# Patient Record
Sex: Female | Born: 1965 | Race: White | Hispanic: No | Smoking: Never smoker
Health system: Southern US, Community
[De-identification: ages and names within clinical notes are randomized; demographics above are authoritative.]

## PROBLEM LIST (undated history)

## (undated) DIAGNOSIS — Z789 Other specified health status: Secondary | ICD-10-CM

## (undated) DIAGNOSIS — K589 Irritable bowel syndrome without diarrhea: Secondary | ICD-10-CM

## (undated) DIAGNOSIS — R51 Headache: Secondary | ICD-10-CM

## (undated) DIAGNOSIS — M1990 Unspecified osteoarthritis, unspecified site: Secondary | ICD-10-CM

## (undated) DIAGNOSIS — L719 Rosacea, unspecified: Secondary | ICD-10-CM

## (undated) HISTORY — DX: Rosacea, unspecified: L71.9

## (undated) HISTORY — PX: MELANOMA EXCISION: SHX5266

## (undated) HISTORY — PX: MOHS SURGERY: SHX181

## (undated) HISTORY — PX: CHOLECYSTECTOMY: SHX55

---

## 2002-12-28 ENCOUNTER — Emergency Department (HOSPITAL_COMMUNITY): Admission: EM | Admit: 2002-12-28 | Discharge: 2002-12-28 | Payer: Self-pay | Admitting: Emergency Medicine

## 2003-01-19 ENCOUNTER — Other Ambulatory Visit: Admission: RE | Admit: 2003-01-19 | Discharge: 2003-01-19 | Payer: Self-pay | Admitting: Family Medicine

## 2003-04-18 ENCOUNTER — Encounter: Admission: RE | Admit: 2003-04-18 | Discharge: 2003-04-18 | Payer: Self-pay | Admitting: Gastroenterology

## 2003-04-18 ENCOUNTER — Encounter: Payer: Self-pay | Admitting: Gastroenterology

## 2005-12-16 HISTORY — PX: KNEE ARTHROSCOPY: SHX127

## 2006-12-11 ENCOUNTER — Ambulatory Visit (HOSPITAL_BASED_OUTPATIENT_CLINIC_OR_DEPARTMENT_OTHER): Admission: RE | Admit: 2006-12-11 | Discharge: 2006-12-11 | Payer: Self-pay | Admitting: Orthopedic Surgery

## 2008-06-06 ENCOUNTER — Ambulatory Visit: Payer: Self-pay | Admitting: Family Medicine

## 2008-06-06 ENCOUNTER — Other Ambulatory Visit: Admission: RE | Admit: 2008-06-06 | Discharge: 2008-06-06 | Payer: Self-pay | Admitting: Family Medicine

## 2008-06-06 ENCOUNTER — Encounter: Payer: Self-pay | Admitting: Family Medicine

## 2008-06-06 DIAGNOSIS — G43009 Migraine without aura, not intractable, without status migrainosus: Secondary | ICD-10-CM | POA: Insufficient documentation

## 2008-06-10 ENCOUNTER — Encounter (INDEPENDENT_AMBULATORY_CARE_PROVIDER_SITE_OTHER): Payer: Self-pay | Admitting: *Deleted

## 2008-06-13 ENCOUNTER — Encounter (INDEPENDENT_AMBULATORY_CARE_PROVIDER_SITE_OTHER): Payer: Self-pay | Admitting: *Deleted

## 2008-06-14 ENCOUNTER — Ambulatory Visit: Payer: Self-pay | Admitting: Family Medicine

## 2008-06-21 ENCOUNTER — Ambulatory Visit: Payer: Self-pay | Admitting: Family Medicine

## 2008-06-28 ENCOUNTER — Ambulatory Visit: Payer: Self-pay | Admitting: Family Medicine

## 2008-07-05 ENCOUNTER — Ambulatory Visit: Payer: Self-pay | Admitting: Family Medicine

## 2008-07-19 ENCOUNTER — Ambulatory Visit: Payer: Self-pay | Admitting: Family Medicine

## 2008-07-26 ENCOUNTER — Ambulatory Visit: Payer: Self-pay | Admitting: Family Medicine

## 2008-08-09 ENCOUNTER — Ambulatory Visit: Payer: Self-pay | Admitting: Family Medicine

## 2008-08-16 ENCOUNTER — Ambulatory Visit: Payer: Self-pay | Admitting: Family Medicine

## 2008-08-23 ENCOUNTER — Ambulatory Visit: Payer: Self-pay | Admitting: Family Medicine

## 2008-08-30 ENCOUNTER — Ambulatory Visit: Payer: Self-pay | Admitting: Family Medicine

## 2008-09-13 ENCOUNTER — Ambulatory Visit: Payer: Self-pay | Admitting: Family Medicine

## 2008-09-20 ENCOUNTER — Ambulatory Visit: Payer: Self-pay | Admitting: Family Medicine

## 2008-09-26 ENCOUNTER — Encounter: Payer: Self-pay | Admitting: Family Medicine

## 2008-09-27 ENCOUNTER — Ambulatory Visit: Payer: Self-pay | Admitting: Family Medicine

## 2008-10-02 LAB — CONVERTED CEMR LAB
ALT: 17 units/L (ref 0–35)
AST: 21 units/L (ref 0–37)
Albumin: 4.3 g/dL (ref 3.5–5.2)
Alkaline Phosphatase: 80 units/L (ref 39–117)
BUN: 16 mg/dL (ref 6–23)
CO2: 22 meq/L (ref 19–32)
Calcium: 8.9 mg/dL (ref 8.4–10.5)
Chloride: 105 meq/L (ref 96–112)
Cholesterol: 219 mg/dL — ABNORMAL HIGH (ref 0–200)
Creatinine, Ser: 0.74 mg/dL (ref 0.40–1.20)
Glucose, Bld: 86 mg/dL (ref 70–99)
HDL: 56 mg/dL (ref >39)
LDL Cholesterol: 128 mg/dL — ABNORMAL HIGH (ref 0–99)
Potassium: 4.3 meq/L (ref 3.5–5.3)
Sodium: 140 meq/L (ref 135–145)
Total Bilirubin: 0.3 mg/dL (ref 0.3–1.2)
Total CHOL/HDL Ratio: 3.9
Total Protein: 6.5 g/dL (ref 6.0–8.3)
Triglycerides: 176 mg/dL — ABNORMAL HIGH (ref <150)
VLDL: 35 mg/dL (ref 0–40)

## 2008-10-11 ENCOUNTER — Ambulatory Visit: Payer: Self-pay | Admitting: Family Medicine

## 2008-10-18 ENCOUNTER — Ambulatory Visit: Payer: Self-pay | Admitting: Family Medicine

## 2008-11-01 ENCOUNTER — Ambulatory Visit: Payer: Self-pay | Admitting: Family Medicine

## 2008-11-14 ENCOUNTER — Encounter: Admission: RE | Admit: 2008-11-14 | Discharge: 2008-11-14 | Payer: Self-pay | Admitting: Orthopedic Surgery

## 2008-11-15 ENCOUNTER — Ambulatory Visit: Payer: Self-pay | Admitting: Family Medicine

## 2008-11-22 ENCOUNTER — Ambulatory Visit: Payer: Self-pay | Admitting: Family Medicine

## 2008-11-29 ENCOUNTER — Telehealth (INDEPENDENT_AMBULATORY_CARE_PROVIDER_SITE_OTHER): Payer: Self-pay | Admitting: *Deleted

## 2008-11-29 ENCOUNTER — Ambulatory Visit: Payer: Self-pay | Admitting: Family Medicine

## 2008-12-27 ENCOUNTER — Ambulatory Visit: Payer: Self-pay | Admitting: Family Medicine

## 2009-01-03 ENCOUNTER — Ambulatory Visit: Payer: Self-pay | Admitting: Family Medicine

## 2009-01-17 ENCOUNTER — Ambulatory Visit: Payer: Self-pay | Admitting: Family Medicine

## 2009-01-17 ENCOUNTER — Encounter: Payer: Self-pay | Admitting: Family Medicine

## 2009-01-17 DIAGNOSIS — J019 Acute sinusitis, unspecified: Secondary | ICD-10-CM | POA: Insufficient documentation

## 2009-01-31 ENCOUNTER — Telehealth (INDEPENDENT_AMBULATORY_CARE_PROVIDER_SITE_OTHER): Payer: Self-pay | Admitting: *Deleted

## 2009-01-31 ENCOUNTER — Ambulatory Visit: Payer: Self-pay | Admitting: Family Medicine

## 2009-03-14 ENCOUNTER — Ambulatory Visit: Payer: Self-pay | Admitting: Family Medicine

## 2009-03-14 DIAGNOSIS — B029 Zoster without complications: Secondary | ICD-10-CM | POA: Insufficient documentation

## 2009-12-19 ENCOUNTER — Encounter: Admission: RE | Admit: 2009-12-19 | Discharge: 2009-12-19 | Payer: Self-pay | Admitting: Obstetrics and Gynecology

## 2009-12-20 ENCOUNTER — Encounter (INDEPENDENT_AMBULATORY_CARE_PROVIDER_SITE_OTHER): Payer: Self-pay | Admitting: *Deleted

## 2010-11-23 ENCOUNTER — Encounter
Admission: RE | Admit: 2010-11-23 | Discharge: 2010-11-23 | Payer: Self-pay | Source: Home / Self Care | Attending: Physical Medicine and Rehabilitation | Admitting: Physical Medicine and Rehabilitation

## 2010-12-16 HISTORY — PX: KNEE ARTHROSCOPY: SHX127

## 2010-12-31 ENCOUNTER — Ambulatory Visit
Admission: RE | Admit: 2010-12-31 | Discharge: 2010-12-31 | Payer: Self-pay | Source: Home / Self Care | Attending: Orthopedic Surgery | Admitting: Orthopedic Surgery

## 2011-01-02 LAB — POCT HEMOGLOBIN-HEMACUE: Hemoglobin: 13.4 g/dL (ref 12.0–15.0)

## 2011-01-03 NOTE — Op Note (Signed)
  NAMEMERRIN, MCVICKER                  ACCOUNT NO.:  [id number]  MEDICAL RECORD NO.:  [id number]          PATIENT TYPE:  AMB  LOCATION:  [facility]SC                          FACILITY:  [facility]  PHYSICIAN:  Feliberto Gottron. Turner Daniels, M.D.   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  PROCEDURE:  Right knee arthroscopic partial medial meniscectomy and debridement of chondromalacia grade 3 medial femoral condyle.  SURGEON:  Feliberto Gottron. Turner Daniels, MD  FIRST ASSISTANT:  None.  ANESTHETIC:  General LMA.  ESTIMATED BLOOD LOSS:  Minimal.  FLUID REPLACEMENT:  Crystalloid 800 mL .  DRAINS PLACED:  None.  TOURNIQUET TIME:  None.  INDICATIONS FOR PROCEDURE:  A 45 year old woman with MRI-proven medial meniscal tear along the root as well as chondromalacia of the medial femoral condyle with catching, popping, and pain who has failed conservative measures with anti-inflammatory medicines, physical therapy, and observation.  She desires elective arthroscopic evaluation and treatment of her right knee.  Risks and benefits of surgery have been discussed, questions answered.  DESCRIPTION OF PROCEDURE:  The patient was identified by armband and received preoperative vancomycin.  She was taken to operating room 2 at the [facility].  Appropriate anesthetic monitors were attached.  General LMA anesthesia was induced with the patient in supine position.  Lateral post applied to the table, and the right lower extremity prepped and draped in the usual sterile fashion from the ankle to the midthigh.  Time-out procedure performed.  We began the operation by making a standard inferomedial and inferolateral peripatellar portals.  The  arthroscope was introduced through the inferolateral portal and the outflow through the inferomedial portal.  Diagnostic arthroscopy revealed very mild chondromalacia of the patellofemoral joint, lightly debrided with a 3.5 gator sucker shaver.  Pump pressure was kept between 16 and 100 mmHg.  Moving on the medial compartment, the patient did have chondromalacia grade 3 with flap tears medial femoral condyle distally and posteriorly and this was debrided back to a stable margin.  The root tear of the medial meniscus was also identified and removed with a small and large straight biters as well as the 3.5 gator sucker shaver.  The ACL and the PCL were intact.  The lateral compartment had very mild chondromalacia of the lateral tibial plateau grade 1-2 lightly debrided.  The meniscus and lateral femoral condyle were in excellent condition.  The gutters were cleared medially and laterally.  We irrigated out with normal saline solution.  Arthroscopic instruments were removed.  Dressing of Xeroform, 4 x 4 dressing, sponges, Webril, and an Ace wrap applied, awakened and taken to recovery room without difficulty.     Feliberto Gottron. Turner Daniels, M.D.     Ovid Curd  D:  12/31/2010  T:  01/01/2011  Job:  [id number]  Electronically Signed by Gean Birchwood M.D. on 01/03/2011 01:25:44 PM

## 2011-01-06 ENCOUNTER — Encounter: Payer: Self-pay | Admitting: Obstetrics and Gynecology

## 2011-01-13 LAB — CONVERTED CEMR LAB
ALT: 20 units/L (ref 0–35)
AST: 21 units/L (ref 0–37)
Albumin: 4.1 g/dL (ref 3.5–5.2)
Alkaline Phosphatase: 78 units/L (ref 39–117)
BUN: 13 mg/dL (ref 6–23)
Basophils Absolute: 0 10*3/uL (ref 0.0–0.1)
Basophils Relative: 0 % (ref 0.0–1.0)
Bilirubin, Direct: 0.1 mg/dL (ref 0.0–0.3)
CO2: 26 meq/L (ref 19–32)
Calcium: 8.9 mg/dL (ref 8.4–10.5)
Chloride: 105 meq/L (ref 96–112)
Cholesterol: 212 mg/dL (ref 0–200)
Creatinine, Ser: 0.8 mg/dL (ref 0.4–1.2)
Direct LDL: 143 mg/dL
Eosinophils Absolute: 0.1 10*3/uL (ref 0.0–0.7)
Eosinophils Relative: 1.6 % (ref 0.0–5.0)
Free T4: 0.9 ng/dL (ref 0.6–1.6)
GFR calc Af Amer: 100 mL/min
GFR calc non Af Amer: 83 mL/min
Glucose, Bld: 90 mg/dL (ref 70–99)
HCT: 38.8 % (ref 36.0–46.0)
HDL: 51 mg/dL (ref >39.0)
Hemoglobin: 13.2 g/dL (ref 12.0–15.0)
IgM, Serum: 114 mg/dL (ref 60–263)
Lymphocytes Relative: 32.8 % (ref 12.0–46.0)
MCHC: 34.1 g/dL (ref 30.0–36.0)
MCV: 87.6 fL (ref 78.0–100.0)
Monocytes Absolute: 0.2 10*3/uL (ref 0.1–1.0)
Monocytes Relative: 2.7 % — ABNORMAL LOW (ref 3.0–12.0)
Neutro Abs: 5.1 10*3/uL (ref 1.4–7.7)
Neutrophils Relative %: 62.9 % (ref 43.0–77.0)
Platelets: 302 10*3/uL (ref 150–400)
Potassium: 4.3 meq/L (ref 3.5–5.1)
RBC: 4.43 M/uL (ref 3.87–5.11)
RDW: 12.4 % (ref 11.5–14.6)
Sodium: 139 meq/L (ref 135–145)
TSH: 0.85 microintl units/mL (ref 0.35–5.50)
Total Bilirubin: 0.7 mg/dL (ref 0.3–1.2)
Total CHOL/HDL Ratio: 4.2
Total Protein: 6.9 g/dL (ref 6.0–8.3)
Triglycerides: 114 mg/dL (ref 0–149)
Uric Acid, Serum: 4.9 mg/dL (ref 2.4–7.0)
VLDL: 23 mg/dL (ref 0–40)
WBC: 8.1 10*3/uL (ref 4.5–10.5)

## 2011-01-15 NOTE — Letter (Signed)
Summary: Imaging Results Letter  [facility]  [address] [place], [place] [address]   Phone: [phone]  Fax: [phone]    12/20/2009 MRN: [id number]  [facility] [address] [place], [place]  [address]  Dear Ms. Forbis,    The following is the interpretation of your radiology test which was performed on 12/19/2009.              Radiology Test:                      Normal __X___  Not Normal _____   Comments:        Sincerely yours,    Army Fossa CMA

## 2011-05-03 NOTE — Op Note (Signed)
NAMENEOSHA, SWITALSKI          ACCOUNT NO.:  [id number]   MEDICAL RECORD NO.:  [id number]          PATIENT TYPE:  AMB   LOCATION:  [facility]                         FACILITY:  [facility]   PHYSICIAN:  Deidre Ala, M.D.    DATE OF BIRTH:  01-13-1966   DATE OF PROCEDURE:  12/11/2006  DATE OF DISCHARGE:                               OPERATIVE REPORT   PREOPERATIVE DIAGNOSIS:  Patellofemoral degenerative joint disease.   POSTOPERATIVE DIAGNOSES:  1. 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  2. Degenerative joint disease, lateral tibial plateau.  3. Tight lateral retinaculum.  4. Large medial and lateral plicas.   OPERATION:  1. Left knee operative arthroscopy with abrasion, ablation      chondroplasty of posterior patella and lateral tibial plateau.  2. Arthroscopic lateral retinacular release.  3. Medial and lateral plica excision.   SURGEON:  1. Charlesetta Shanks, M.D.   ASSISTANT:  Phineas Semen, P.A., and Shawna Clamp, PA Student.   ANESTHESIA:  General with LMA.   CULTURES:  None.   DRAINS:  None.   ESTIMATED BLOOD LOSS:  Minimal.   TOURNIQUET TIME:  35 minutes.   PATHOLOGIC FINDINGS AND HISTORY:  Ataya is a 45 year old female with  high body mass index, who has had knee pain after a twisting injury to  her knee over a year ago when she saw me October 16, 2005.  We have  treated her conservatively with cortisone injections.  MRI scan  indicated patellofemoral disease but negative for meniscal tear or cyst.  She had a series of injections, always were helpful, but her pain  persisted and she desired surgical intervention.  At surgery she had  about over 3/4 of the posterior patella grade 3-4 patellofemoral  disease, blood points to bone.  She had a tight lateral retinaculum.  She had marked medial and lateral plicas.  She had ACL intact, both  menisci intact, and a deep fissure along the lateral tibial plateau.  The osteoarthritis was smoothed with the shaver and the  ablator on 1,  lateral retinacular release carried out and plicas excised.   PROCEDURE:  With adequate anesthesia obtained using LMA technique, 1 g  Ancef given IV prophylaxis, the patient was placed in the supine  position.  The left lower extremity was prepped from the malleoli to the  leg holder in a standard fashion.  After standard prepping and draping,  Esmarch exsanguination was used.  The tourniquet was let up to 375 mmHg.  Superior lateral inflow portal was made.  The knee was insufflated with  normal saline with the arthroscopic pump.  Medial and lateral scope  portals were then made and the joint was thoroughly inspected.  I then  shaved out the medial plica back the sidewall and lysed the medial band.  I then shaved out the fat in the ligamentum mucosum and then exposed the  ACL and probed it.  I then opened the knee and a valgus motion and  checked the medial meniscus, which was intact to probing.  The joint  surfaces looked good.  I then shaved the posterior patella and smoothed  with the ablator on 1.  Portals were reversed and I checked the lateral  compartment, probed the lateral fissure within the lateral tibial  plateau, and smoothed with the shaver and the ablator.  I then smoothed  the posterior patella with the ablator again and did an arthroscopic  lateral retinacular release from the vastus lateralis to the joint line  with good improvement in tilt and track.  I then had some chondromalacia  on the medial patellar facet, which was still difficult to get to, so I  made a superior medial portal and took the shaver and ablator in and  smoothed that.  When I was satisfied with the resection, the knee was  irrigated through the scope.  Marcaine 0.5% with morphine was injected  into the joint.  The portals were left open.  A bulky sterile  compressive dressing was applied with a lateral foam pad for tamponade,  and an Easy Wrap placed.  The patient then, having tolerated  the  procedure well, was awakened, taken to the recovery room in satisfactory  condition, to be discharged to return tomorrow or the first week,  depending on transportation.  Given Percocet for pain.           ______________________________  V. Charlesetta Shanks, M.D.     VEP/MEDQ  D:  12/11/2006  T:  12/11/2006  Job:  [id number]

## 2011-06-11 ENCOUNTER — Other Ambulatory Visit: Payer: Self-pay | Admitting: Family Medicine

## 2011-06-11 DIAGNOSIS — Z1231 Encounter for screening mammogram for malignant neoplasm of breast: Secondary | ICD-10-CM

## 2011-06-12 ENCOUNTER — Ambulatory Visit
Admission: RE | Admit: 2011-06-12 | Discharge: 2011-06-12 | Disposition: A | Discharge status: Home / Self Care | Payer: 59 | Source: Ambulatory Visit | Attending: Family Medicine | Admitting: Family Medicine

## 2011-06-12 DIAGNOSIS — Z1231 Encounter for screening mammogram for malignant neoplasm of breast: Secondary | ICD-10-CM

## 2011-11-21 ENCOUNTER — Other Ambulatory Visit: Payer: Self-pay | Admitting: Orthopedic Surgery

## 2011-11-25 ENCOUNTER — Encounter (HOSPITAL_BASED_OUTPATIENT_CLINIC_OR_DEPARTMENT_OTHER): Payer: Self-pay | Admitting: *Deleted

## 2011-11-26 NOTE — H&P (Signed)
  HISTORY OF PRESENT ILLNESS:  Leslie Fischer had the onset of left knee pain about three weeks ago. She was seen by Dr. Regino Schultze who ordered an MRI scan, which did show a radial tear of the medial meniscus at the junction of the anterior and middle horns, similar to what she had in her right knee prior to surgery back in early 2012.  She is here to discuss her options and is leaning heavily toward surgery, but probably cannot get it down until sometime after Thanksgiving and therefore we discussed a cortisone injection.  REVIEW OF SYSTEMS: Patient denies dizziness, nausea, fever, chills, vomiting, shortness of breath, chest pain, loss of appetite, or rash.    OBJECTIVE:  The patient is a well-developed and well-nourished 45 year old female in no acute distress while seated on the exam table today. .  Eyes not dilated.  No use of accessory muscles for breathing.  No rashes on the skin.  She is alert and oriented x3. She is tender to palpation along the medial joint line.  Range of motion is 0 to approximately 110 degrees, limited by adipose tissue.  No palpable effusion, but her physical exam is somewhat difficult as her BMI is well over 40.  Quadriceps and hamstring strength are excellent.  She has normal motor and sensory exams and deep tendon reflexes are normal.  RADIOGRAPHS:  Previous x-rays are reviewed showing moderate to severe arthritis on the right side at the medial compartment and mild arthritis on the left side at the medial compartment.  IMPRESSION:  Symptomatic medial meniscal tear left knee, similar to findings on the right knee prior to surgery in January of 2012.  PLAN: Injection as above.  I am also going to have her talk to Raylene Miyamoto about left knee arthroscopy at her [facility].

## 2011-11-27 ENCOUNTER — Encounter (HOSPITAL_BASED_OUTPATIENT_CLINIC_OR_DEPARTMENT_OTHER): Admission: RE | Payer: Self-pay | Source: Ambulatory Visit

## 2011-11-27 ENCOUNTER — Ambulatory Visit (HOSPITAL_BASED_OUTPATIENT_CLINIC_OR_DEPARTMENT_OTHER): Admission: RE | Admit: 2011-11-27 | Payer: 59 | Source: Ambulatory Visit | Admitting: Orthopedic Surgery

## 2011-11-27 HISTORY — DX: Irritable bowel syndrome, unspecified: K58.9

## 2011-11-27 HISTORY — DX: Headache: R51

## 2011-11-27 HISTORY — DX: Unspecified osteoarthritis, unspecified site: M19.90

## 2011-11-27 HISTORY — DX: Other specified health status: Z78.9

## 2011-11-27 SURGERY — ARTHROSCOPY, KNEE, BILATERAL
Anesthesia: Choice | Laterality: Left

## 2012-03-18 DIAGNOSIS — E669 Obesity, unspecified: Secondary | ICD-10-CM

## 2012-03-25 DIAGNOSIS — E669 Obesity, unspecified: Secondary | ICD-10-CM

## 2012-04-08 DIAGNOSIS — E669 Obesity, unspecified: Secondary | ICD-10-CM

## 2012-04-15 DIAGNOSIS — E669 Obesity, unspecified: Secondary | ICD-10-CM

## 2012-04-29 DIAGNOSIS — E669 Obesity, unspecified: Secondary | ICD-10-CM

## 2012-05-06 DIAGNOSIS — E669 Obesity, unspecified: Secondary | ICD-10-CM

## 2012-05-15 DIAGNOSIS — E669 Obesity, unspecified: Secondary | ICD-10-CM

## 2012-05-20 DIAGNOSIS — E669 Obesity, unspecified: Secondary | ICD-10-CM

## 2012-05-27 DIAGNOSIS — E669 Obesity, unspecified: Secondary | ICD-10-CM

## 2012-06-01 ENCOUNTER — Ambulatory Visit
Admission: RE | Admit: 2012-06-01 | Discharge: 2012-06-01 | Disposition: A | Discharge status: Home / Self Care | Payer: 59 | Source: Ambulatory Visit | Attending: Family Medicine | Admitting: Family Medicine

## 2012-06-01 ENCOUNTER — Other Ambulatory Visit: Payer: Self-pay | Admitting: Family Medicine

## 2012-06-01 DIAGNOSIS — R1031 Right lower quadrant pain: Secondary | ICD-10-CM

## 2012-06-01 DIAGNOSIS — K5900 Constipation, unspecified: Secondary | ICD-10-CM

## 2012-06-01 MED ORDER — IOHEXOL 300 MG/ML  SOLN
30.0000 mL | Freq: Once | INTRAMUSCULAR | Status: AC | PRN
  Administered 2012-06-01: 30 mL via ORAL

## 2012-06-01 MED ORDER — IOHEXOL 300 MG/ML  SOLN
125.0000 mL | Freq: Once | INTRAMUSCULAR | Status: AC | PRN
  Administered 2012-06-01: 125 mL via INTRAVENOUS

## 2012-06-10 DIAGNOSIS — E669 Obesity, unspecified: Secondary | ICD-10-CM

## 2012-06-17 ENCOUNTER — Other Ambulatory Visit: Payer: Self-pay | Admitting: Family Medicine

## 2012-06-17 DIAGNOSIS — E669 Obesity, unspecified: Secondary | ICD-10-CM

## 2012-06-17 LAB — LIPID PANEL
Cholesterol: 195 mg/dL (ref 0–200)
HDL: 41 mg/dL (ref >39)
LDL Cholesterol: 117 mg/dL — ABNORMAL HIGH (ref 0–99)
Total CHOL/HDL Ratio: 4.8 Ratio
Triglycerides: 183 mg/dL — ABNORMAL HIGH (ref <150)
VLDL: 37 mg/dL (ref 0–40)

## 2012-06-17 LAB — COMPREHENSIVE METABOLIC PANEL
ALT: 20 U/L (ref 0–35)
AST: 20 U/L (ref 0–37)
Albumin: 4.2 g/dL (ref 3.5–5.2)
Alkaline Phosphatase: 69 U/L (ref 39–117)
BUN: 16 mg/dL (ref 6–23)
CO2: 26 mEq/L (ref 19–32)
Calcium: 9.2 mg/dL (ref 8.4–10.5)
Chloride: 105 mEq/L (ref 96–112)
Creat: 0.9 mg/dL (ref 0.50–1.10)
Glucose, Bld: 94 mg/dL (ref 70–99)
Potassium: 4.9 mEq/L (ref 3.5–5.3)
Sodium: 141 mEq/L (ref 135–145)
Total Bilirubin: 0.3 mg/dL (ref 0.3–1.2)
Total Protein: 6.5 g/dL (ref 6.0–8.3)

## 2012-06-24 DIAGNOSIS — E669 Obesity, unspecified: Secondary | ICD-10-CM

## 2012-07-01 DIAGNOSIS — E669 Obesity, unspecified: Secondary | ICD-10-CM

## 2012-08-27 ENCOUNTER — Other Ambulatory Visit: Payer: Self-pay | Admitting: Family Medicine

## 2012-08-27 DIAGNOSIS — Z1231 Encounter for screening mammogram for malignant neoplasm of breast: Secondary | ICD-10-CM

## 2012-09-14 ENCOUNTER — Ambulatory Visit: Payer: 59

## 2012-12-14 ENCOUNTER — Ambulatory Visit: Payer: 59

## 2012-12-14 ENCOUNTER — Ambulatory Visit
Admission: RE | Admit: 2012-12-14 | Discharge: 2012-12-14 | Disposition: A | Discharge status: Home / Self Care | Payer: 59 | Source: Ambulatory Visit | Attending: Family Medicine | Admitting: Family Medicine

## 2012-12-14 DIAGNOSIS — Z1231 Encounter for screening mammogram for malignant neoplasm of breast: Secondary | ICD-10-CM

## 2013-01-27 DIAGNOSIS — E669 Obesity, unspecified: Secondary | ICD-10-CM

## 2020-03-27 ENCOUNTER — Ambulatory Visit: Payer: Self-pay

## 2020-04-07 ENCOUNTER — Ambulatory Visit
Admission: EM | Admit: 2020-04-07 | Discharge: 2020-04-07 | Disposition: A | Discharge status: Home / Self Care | Payer: Self-pay | Attending: Emergency Medicine | Admitting: Emergency Medicine

## 2020-04-07 DIAGNOSIS — W57XXXA Bitten or stung by nonvenomous insect and other nonvenomous arthropods, initial encounter: Secondary | ICD-10-CM

## 2020-04-07 DIAGNOSIS — M79645 Pain in left finger(s): Secondary | ICD-10-CM

## 2020-04-07 DIAGNOSIS — S60465A Insect bite (nonvenomous) of left ring finger, initial encounter: Secondary | ICD-10-CM

## 2020-04-07 MED ORDER — PREDNISONE 10 MG PO TABS: 20.0000 mg | ORAL_TABLET | Freq: Every day | ORAL | 0 refills | Status: DC

## 2020-04-07 MED ORDER — CEPHALEXIN 500 MG PO CAPS: 500.0000 mg | ORAL_CAPSULE | Freq: Four times a day (QID) | ORAL | 0 refills | Status: DC

## 2020-04-07 NOTE — ED Provider Notes (Signed)
[facility]    CSN: [id number] Arrival date & time: 04/07/20  0810      History   Chief Complaint Chief Complaint  Patient presents with  . Insect Bite    HPI Leslie Fischer is a 54 y.o. female.   Who presented to the urgent care for complaint of left ring finger bite for the past 1 day.  Denies a precipitating event, noticed  this morning.  Localizes the bite to her left ring finger.  Reported she has a wedding ring at this location in her finger is swollen, therefore  the ring is stuck.  Has not tried any medication.  Denies previous hx of insect bite.  Reports she is unsure of what insect bite her.  Denies fever, chills, nausea, vomiting, headache, dizziness, weakness, fatigue, rash, or abdominal pain.   The history is provided by the patient. No language interpreter was used.    Past Medical History:  Diagnosis Date  . Arthritis   . Headache(784.0)   . IBS (irritable bowel syndrome)   . No pertinent past medical history     Patient Active Problem List   Diagnosis Date Noted  . SHINGLES 03/14/2009  . SINUSITIS- ACUTE-NOS 01/17/2009  . MORBID OBESITY 06/06/2008  . COMMON MIGRAINE 06/06/2008    Past Surgical History:  Procedure Laterality Date  . CHOLECYSTECTOMY    . KNEE ARTHROSCOPY  1/12   rt   . KNEE ARTHROSCOPY  2007   lt     OB History   No obstetric history on file.      Home Medications    Prior to Admission medications   Medication Sig Start Date End Date Taking? Authorizing Provider  cephALEXin (KEFLEX) 500 MG capsule Take 1 capsule (500 mg total) by mouth 4 (four) times daily. 04/07/20   Emelie Newsom, Darrelyn Hillock, FNP  predniSONE (DELTASONE) 10 MG tablet Take 2 tablets (20 mg total) by mouth daily. 04/07/20   AvegnoDarrelyn Hillock, FNP    Family History Family History  Problem Relation Age of Onset  . Hypertension Mother     Social History Social History   Tobacco Use  . Smoking status: Never Smoker  Substance Use Topics  . Alcohol  use: No  . Drug use: No     Allergies   Patient has no known allergies.   Review of Systems Review of Systems  Constitutional: Negative.   Respiratory: Negative.   Cardiovascular: Negative.   Skin: Positive for color change.  All other systems reviewed and are negative.    Physical Exam Triage Vital Signs ED Triage Vitals  Enc Vitals Group     BP      Pulse      Resp      Temp      Temp src      SpO2      Weight      Height      Head Circumference      Peak Flow      Pain Score      Pain Loc      Pain Edu?      Excl. in [place]?    No data found.  Updated Vital Signs BP (!) 169/113   Pulse (!) 104   Temp 98.3 F (36.8 C)   Resp 18   LMP 05/18/2012   SpO2 95%   Visual Acuity Right Eye Distance:   Left Eye Distance:   Bilateral Distance:    Right Eye Near:  Left Eye Near:    Bilateral Near:     Physical Exam Vitals and nursing note reviewed.  Constitutional:      General: She is not in acute distress.    Appearance: Normal appearance. She is normal weight. She is not ill-appearing, toxic-appearing or diaphoretic.  Cardiovascular:     Rate and Rhythm: Normal rate and regular rhythm.     Pulses: Normal pulses.     Heart sounds: Normal heart sounds. No murmur. No friction rub. No gallop.   Pulmonary:     Effort: Pulmonary effort is normal. No respiratory distress.     Breath sounds: Normal breath sounds. No stridor. No wheezing, rhonchi or rales.  Chest:     Chest wall: No tenderness.  Musculoskeletal:        General: Normal range of motion.  Skin:    Findings: Erythema present. No rash.     Comments: Swelling and erythema present on left ring finger. Unable to remove wedding ring.  Neurological:     Mental Status: She is alert.      UC Treatments / Results  Labs (all labs ordered are listed, but only abnormal results are displayed) Labs Reviewed - No data to display  EKG   Radiology No results found.  Procedures Procedures  (including critical care time)  Medications Ordered in UC Medications - No data to display  Initial Impression / Assessment and Plan / UC Course  I have reviewed the triage vital signs and the nursing notes.  Pertinent labs & imaging results that were available during my care of the patient were reviewed by me and considered in my medical decision making (see chart for details).    Patient  is stable for discharge.  Keflex will be prescribed.  Prednisone as prescribed to decrease inflammation.  Return or go to ED for worsening symptoms.  Final Clinical Impressions(s) / UC Diagnoses   Final diagnoses:  Insect bite of left ring finger, initial encounter  Finger pain, left     Discharge Instructions     Take antibiotic as prescribed and to completion Take prednisone as prescribed Follow-up with PCP Return or go to ED for worsening symptoms    ED Prescriptions    Medication Sig Dispense Auth. Provider   cephALEXin (KEFLEX) 500 MG capsule  (Status: Discontinued) Take 1 capsule (500 mg total) by mouth 4 (four) times daily. 20 capsule Robyn Galati S, FNP   predniSONE (DELTASONE) 10 MG tablet  (Status: Discontinued) Take 2 tablets (20 mg total) by mouth daily. 15 tablet Shanae Luo, Zachery Dakins, FNP   cephALEXin (KEFLEX) 500 MG capsule  (Status: Discontinued) Take 1 capsule (500 mg total) by mouth 4 (four) times daily. 20 capsule Cahlil Sattar S, FNP   predniSONE (DELTASONE) 10 MG tablet  (Status: Discontinued) Take 2 tablets (20 mg total) by mouth daily. 15 tablet Keiji Melland, Zachery Dakins, FNP   cephALEXin (KEFLEX) 500 MG capsule Take 1 capsule (500 mg total) by mouth 4 (four) times daily. 20 capsule Mirakle Tomlin S, FNP   predniSONE (DELTASONE) 10 MG tablet Take 2 tablets (20 mg total) by mouth daily. 15 tablet Elisah Parmer, Zachery Dakins, FNP     PDMP not reviewed this encounter.   Durward Parcel, FNP 04/07/20 [phone]

## 2020-04-07 NOTE — ED Triage Notes (Signed)
Pt has insect bite on ring finger and ring is stuck

## 2020-04-07 NOTE — Discharge Instructions (Signed)
Take antibiotic as prescribed and to completion Take prednisone as prescribed Follow-up with PCP Return or go to ED for worsening symptoms

## 2020-04-10 ENCOUNTER — Other Ambulatory Visit: Payer: Self-pay

## 2020-04-10 ENCOUNTER — Ambulatory Visit
Admission: EM | Admit: 2020-04-10 | Discharge: 2020-04-10 | Disposition: A | Discharge status: Home / Self Care | Payer: Self-pay

## 2020-04-10 DIAGNOSIS — L02512 Cutaneous abscess of left hand: Secondary | ICD-10-CM

## 2020-04-10 NOTE — Discharge Instructions (Signed)
Abscess lanced Begin appling warm compresses 3-4x daily for 10-15 minutes.  You may then wash site daily with warm water and mild soap Keep covered to avoid friction Apply neosporin with dressing changes Continue with antibiotic as prescribed and to completion Return or go to the ED if you have any new or worsening symptoms such as increased redness, swelling, pain, nausea, vomiting, fever, chills, etc..Marland Kitchen

## 2020-04-10 NOTE — ED Triage Notes (Signed)
Pt presents with increased abscess on ring finger from possible insect bite, pain is improved

## 2020-04-10 NOTE — ED Provider Notes (Addendum)
[facility]   [id number] 04/10/20 Arrival Time: 0840   CC: Incision and drainage  SUBJECTIVE:  Leslie Fischer is a 54 y.o. female who presents with a possible abscess of her LT ring finger x 4 days. Speculates she may have had a spider bite.  Was seen at UC this past weekend and treated with keflex.  Reports improvement in pain, swelling and redness, but has noticed a pustule.  Would liked to have it drained.  Denies fever, chills, nausea, vomiting.    ROS: As per HPI.  All other pertinent ROS negative.     Past Medical History:  Diagnosis Date  . Arthritis   . Headache(784.0)   . IBS (irritable bowel syndrome)   . No pertinent past medical history    Past Surgical History:  Procedure Laterality Date  . CHOLECYSTECTOMY    . KNEE ARTHROSCOPY  1/12   rt   . KNEE ARTHROSCOPY  2007   lt    No Known Allergies No current facility-administered medications on file prior to encounter.   Current Outpatient Medications on File Prior to Encounter  Medication Sig Dispense Refill  . cephALEXin (KEFLEX) 500 MG capsule Take 1 capsule (500 mg total) by mouth 4 (four) times daily. 20 capsule 0  . predniSONE (DELTASONE) 10 MG tablet Take 2 tablets (20 mg total) by mouth daily. 15 tablet 0   Social History   Socioeconomic History  . Marital status: Married    Spouse name: Not on file  . Number of children: Not on file  . Years of education: Not on file  . Highest education level: Not on file  Occupational History  . Not on file  Tobacco Use  . Smoking status: Never Smoker  Substance and Sexual Activity  . Alcohol use: No  . Drug use: No  . Sexual activity: Not on file  Other Topics Concern  . Not on file  Social History Narrative  . Not on file   Social Determinants of Health   Financial Resource Strain:   . Difficulty of Paying Living Expenses:   Food Insecurity:   . Worried About Programme researcher, broadcasting/film/video in the Last Year:   . Barista in the Last Year:     Transportation Needs:   . Freight forwarder (Medical):   Marland Kitchen Lack of Transportation (Non-Medical):   Physical Activity:   . Days of Exercise per Week:   . Minutes of Exercise per Session:   Stress:   . Feeling of Stress :   Social Connections:   . Frequency of Communication with Friends and Family:   . Frequency of Social Gatherings with Friends and Family:   . Attends Religious Services:   . Active Member of Clubs or Organizations:   . Attends Banker Meetings:   Marland Kitchen Marital Status:   Intimate Partner Violence:   . Fear of Current or Ex-Partner:   . Emotionally Abused:   Marland Kitchen Physically Abused:   . Sexually Abused:    Family History  Problem Relation Age of Onset  . Hypertension Mother     OBJECTIVE:  Vitals:   04/10/20 0843  BP: (!) 152/101  Pulse: 83  Resp: 16  Temp: 98.1 F (36.7 C)  TempSrc: Oral  SpO2: 95%     General appearance: alert; no distress CV: radial pulse 2+; cap refill < 2 seconds Skin: 0.5-1 cm abscess to posterior proximal phalanges aspect of LT fourth finger Psychological: alert and cooperative; normal  mood and affect  Procedure: Verbal consent obtained. Area over induration cleaned with betadine.  The most fluctuant portion of the abscess was incised with a #11 blade scalpel. Abscess cavity explored and evacuated.  Minimal bleeding. No complications.  ASSESSMENT & PLAN:  1. Abscess of finger of left hand    Abscess lanced Begin appling warm compresses 3-4x daily for 10-15 minutes.  You may then wash site daily with warm water and mild soap Keep covered to avoid friction Apply neosporin with dressing changes Continue with antibiotic as prescribed and to completion Return or go to the ED if you have any new or worsening symptoms such as increased redness, swelling, pain, nausea, vomiting, fever, chills, etc...   Reviewed expectations re: course of current medical issues. Questions answered. Outlined signs and symptoms  indicating need for more acute intervention. Patient verbalized understanding. After Visit Summary given.          Lestine Box, PA-C 04/10/20 [place], [place], PA-C 04/10/20 [phone]

## 2020-05-24 ENCOUNTER — Ambulatory Visit (INDEPENDENT_AMBULATORY_CARE_PROVIDER_SITE_OTHER): Payer: Self-pay | Admitting: Nurse Practitioner

## 2020-06-01 ENCOUNTER — Ambulatory Visit (INDEPENDENT_AMBULATORY_CARE_PROVIDER_SITE_OTHER): Payer: 59 | Admitting: Nurse Practitioner

## 2020-06-01 ENCOUNTER — Other Ambulatory Visit: Payer: Self-pay

## 2020-06-01 VITALS — BP 136/89 | HR 89 | Temp 96.6°F | Resp 18 | Ht 62.0 in | Wt 229.4 lb

## 2020-06-01 DIAGNOSIS — Z131 Encounter for screening for diabetes mellitus: Secondary | ICD-10-CM | POA: Diagnosis not present

## 2020-06-01 DIAGNOSIS — Z1329 Encounter for screening for other suspected endocrine disorder: Secondary | ICD-10-CM | POA: Diagnosis not present

## 2020-06-01 DIAGNOSIS — Z6841 Body Mass Index (BMI) 40.0 and over, adult: Secondary | ICD-10-CM

## 2020-06-01 DIAGNOSIS — Z13220 Encounter for screening for lipoid disorders: Secondary | ICD-10-CM

## 2020-06-01 DIAGNOSIS — E66813 Obesity, class 3: Secondary | ICD-10-CM

## 2020-06-01 DIAGNOSIS — Z1231 Encounter for screening mammogram for malignant neoplasm of breast: Secondary | ICD-10-CM | POA: Diagnosis not present

## 2020-06-01 DIAGNOSIS — Z124 Encounter for screening for malignant neoplasm of cervix: Secondary | ICD-10-CM

## 2020-06-01 NOTE — Patient Instructions (Signed)
[facility] Dietary Recommendations for Weight Loss What to Avoid . Avoid added sugars o Often added sugar can be found in processed foods such as many condiments, dry cereals, cakes, cookies, chips, crisps, crackers, candies, sweetened drinks, etc.  o Read labels and AVOID/DECREASE use of foods with the following in their ingredient list: Sugar, fructose, high fructose corn syrup, sucrose, glucose, maltose, dextrose, molasses, cane sugar, brown sugar, any type of syrup, agave nectar, etc.   . Avoid snacking in between meals . Avoid foods made with flour o If you are going to eat food made with flour, choose those made with whole-grains; and, minimize your consumption as much as is tolerable . Avoid processed foods o These foods are generally stocked in the middle of the grocery store. Focus on shopping on the perimeter of the grocery.  . Avoid Meat  o We recommend following a plant-based diet at [facility]. Thus, we recommend avoiding meat as a general rule. Consider eating beans, legumes, eggs, and/or dairy products for regular protein sources o If you plan on eating meat limit to 4 ounces of meat at a time and choose lean options such as Fish, chicken, turkey. Avoid red meat intake such as pork and/or steak What to Include . Vegetables o GREEN LEAFY VEGETABLES: Kale, spinach, mustard greens, collard greens, cabbage, broccoli, etc. o OTHER: Asparagus, cauliflower, eggplant, carrots, peas, Brussel sprouts, tomatoes, bell peppers, zucchini, beets, cucumbers, etc. . Grains, seeds, and legumes o Beans: kidney beans, black eyed peas, garbanzo beans, black beans, pinto beans, etc. o Whole, unrefined grains: brown rice, barley, bulgur, oatmeal, etc. . Healthy fats  o Avoid highly processed fats such as vegetable oil o Examples of healthy fats: avocado, olives, virgin olive oil, dark chocolate (?72% Cocoa), nuts (peanuts, almonds, walnuts, cashews, pecans, etc.) . None to Low  Intake of Animal Sources of Protein o Meat sources: chicken, turkey, salmon, tuna. Limit to 4 ounces of meat at one time. o Consider limiting dairy sources, but when choosing dairy focus on: PLAIN Greek yogurt, cottage cheese, high-protein milk . Fruit o Choose berries  When to Eat . Intermittent Fasting: o Choosing not to eat for a specific time period, but DO FOCUS ON HYDRATION when fasting o Multiple Techniques: - Time Restricted Eating: eat 3 meals in a day, each meal lasting no more than 60 minutes, no snacks between meals - 16-18 hour fast: fast for 16 to 18 hours up to 7 days a week. Often suggested to start with 2-3 nonconsecutive days per week.  . Remember the time you sleep is counted as fasting.  . Examples of eating schedule: Fast from 7:00pm-11:00am. Eat between 11:00am-7:00pm.  - 24-hour fast: fast for 24 hours up to every other day. Often suggested to start with 1 day per week . Remember the time you sleep is counted as fasting . Examples of eating schedule:  o Eating day: eat 2-3 meals on your eating day. If doing 2 meals, each meal should last no more than 90 minutes. If doing 3 meals, each meal should last no more than 60 minutes. Finish last meal by 7:00pm. o Fasting day: Fast until 7:00pm.  o IF YOU FEEL UNWELL FOR ANY REASON/IN ANY WAY WHEN FASTING, STOP FASTING BY EATING A NUTRITIOUS SNACK OR LIGHT MEAL o ALWAYS FOCUS ON HYDRATION DURING FASTS - Acceptable Hydration sources: water, broths, tea/coffee (black tea/coffee is best but using a small amount of whole-fat dairy products in coffee/tea is acceptable).  -   Poor Hydration Sources: anything with sugar or artificial sweeteners added to it  These recommendations have been developed for patients that are actively receiving medical care from either Dr. Gosrani or Garielle Mroz, DNP, NP-C at [facility]. These recommendations are developed for patients with specific medical conditions and are not meant to be  distributed or used by others that are not actively receiving care from either provider listed above at [facility]. It is not appropriate to participate in the above eating plans without proper medical supervision.   Reference: Fung, J. The obesity code. [place]: [place]; 2016.   

## 2020-06-01 NOTE — Progress Notes (Signed)
Subjective:  Patient ID: Leslie Fischer, female    DOB: 1966-08-14  Age: 54 y.o. MRN: [id number]  CC:  Chief Complaint  Patient presents with  . New Patient (Initial Visit)      HPI  This patient arrives today to establish care at this office.  Her previous provider has retired, so she is coming here for a new primary care provider.  She is a 54 year old female with a past medical history significant for rosacea and obesity.  She has no acute complaints today.   Past Medical History:  Diagnosis Date  . Arthritis   . Headache(784.0)   . IBS (irritable bowel syndrome)   . No pertinent past medical history       Family History  Problem Relation Age of Onset  . Hypertension Mother     Social History   Social History Narrative  . Not on file   Social History   Tobacco Use  . Smoking status: Never Smoker  Substance Use Topics  . Alcohol use: No     Current Meds  Medication Sig  . acetaminophen (TYLENOL) 500 MG tablet Take 500 mg by mouth every 6 (six) hours as needed for mild pain. As needed.  . Calcium Carb-Cholecalciferol (CALTRATE 600+D3) 600-800 MG-UNIT TABS Take by mouth.  . diphenhydrAMINE (BENADRYL) 25 MG tablet Take 25 mg by mouth every 6 (six) hours as needed for allergies.  Marland Kitchen doxycycline (DORYX) 100 MG EC tablet Take 100 mg by mouth 2 (two) times daily.  . Multiple Vitamin (MULTIVITAMIN WITH MINERALS) TABS tablet Take 1 tablet by mouth daily.  . naproxen sodium (ALEVE) 220 MG tablet Take 220 mg by mouth.  . Probiotic Product (TRUBIOTICS PO) Take by mouth.  . zinc gluconate 50 MG tablet Take 50 mg by mouth daily.    ROS:  Review of Systems  Constitutional: Negative for chills, fever and malaise/fatigue.  Respiratory: Negative for shortness of breath.   Cardiovascular: Negative for chest pain.  Gastrointestinal: Negative for abdominal pain and blood in stool.  Neurological: Negative for dizziness and headaches.     Objective:   Today's Vitals:  BP 136/89 (BP Location: Left Arm, Patient Position: Sitting, Cuff Size: Normal)   Pulse 89   Temp (!) 96.6 F (35.9 C) (Temporal)   Resp 18   Ht 5' 2" (1.575 m)   Wt 229 lb 6.4 oz (104.1 kg)   LMP 05/18/2012   SpO2 98%   BMI 41.96 kg/m  Vitals with BMI 06/01/2020 04/10/2020 04/07/2020  Height 5' 2" - -  Weight 229 lbs 6 oz - -  BMI 22.29 - -  Systolic 798 921 194  Diastolic 89 174 081  Pulse 89 83 104     Physical Exam Vitals reviewed.  Constitutional:      General: She is not in acute distress.    Appearance: Normal appearance. She is obese.  HENT:     Head: Normocephalic and atraumatic.  Neck:     Vascular: No carotid bruit.  Cardiovascular:     Rate and Rhythm: Normal rate and regular rhythm.     Pulses: Normal pulses.     Heart sounds: Normal heart sounds.  Pulmonary:     Effort: Pulmonary effort is normal.     Breath sounds: Normal breath sounds.  Skin:    General: Skin is warm and dry.  Neurological:     General: No focal deficit present.     Mental Status: She is alert and  oriented to person, place, and time.  Psychiatric:        Mood and Affect: Mood normal.        Behavior: Behavior normal.        Judgment: Judgment normal.          Assessment and Plan   1. Cervical cancer screening   2. Encounter for screening mammogram for malignant neoplasm of breast   3. Screening for diabetes mellitus   4. Screening for thyroid disorder   5. Screening for lipid disorders   6. Class 3 severe obesity without serious comorbidity with body mass index (BMI) of 40.0 to 44.9 in adult, unspecified obesity type ([place])      Plan: 1.-2.  She is due for both cervical cancer screening and breast cancer screening.  I will send referrals and orders for these to be completed.  3.-6.  We will collect blood work today for further evaluation.  I will give her a handout regarding recommendations for diet aimed at helping her maintain a healthy weight.  We will discuss this  further at her next office visit on goal of her blood work.   Tests ordered Orders Placed This Encounter  Procedures  . MM Digital Diagnostic Bilat  . CBC  . CMP with eGFR([facility])  . Lipid Panel  . TSH  . Hemoglobin A1c  . Ambulatory referral to Obstetrics / Gynecology      No orders of the defined types were placed in this encounter.   Patient to follow-up in 1 month or sooner as needed.  Ailene Ards, NP

## 2020-06-02 LAB — COMPLETE METABOLIC PANEL WITH GFR
AG Ratio: 2 (calc) (ref 1.0–2.5)
ALT: 17 U/L (ref 6–29)
AST: 15 U/L (ref 10–35)
Albumin: 4.3 g/dL (ref 3.6–5.1)
Alkaline phosphatase (APISO): 64 U/L (ref 37–153)
BUN: 18 mg/dL (ref 7–25)
CO2: 23 mmol/L (ref 20–32)
Calcium: 9.3 mg/dL (ref 8.6–10.4)
Chloride: 106 mmol/L (ref 98–110)
Creat: 0.78 mg/dL (ref 0.50–1.05)
GFR, Est African American: 98 mL/min/{1.73_m2} (ref >=60)
GFR, Est Non African American: 85 mL/min/{1.73_m2} (ref >=60)
Globulin: 2.1 g/dL (calc) (ref 1.9–3.7)
Glucose, Bld: 99 mg/dL (ref 65–99)
Potassium: 4.4 mmol/L (ref 3.5–5.3)
Sodium: 137 mmol/L (ref 135–146)
Total Bilirubin: 0.2 mg/dL (ref 0.2–1.2)
Total Protein: 6.4 g/dL (ref 6.1–8.1)

## 2020-06-02 LAB — LIPID PANEL
Cholesterol: 205 mg/dL — ABNORMAL HIGH (ref <200)
HDL: 51 mg/dL (ref >=50)
LDL Cholesterol (Calc): 121 mg/dL (calc) — ABNORMAL HIGH
Non-HDL Cholesterol (Calc): 154 mg/dL (calc) — ABNORMAL HIGH (ref <130)
Total CHOL/HDL Ratio: 4 (calc) (ref <5.0)
Triglycerides: 221 mg/dL — ABNORMAL HIGH (ref <150)

## 2020-06-02 LAB — CBC
HCT: 37.5 % (ref 35.0–45.0)
Hemoglobin: 12.3 g/dL (ref 11.7–15.5)
MCH: 28.9 pg (ref 27.0–33.0)
MCHC: 32.8 g/dL (ref 32.0–36.0)
MCV: 88 fL (ref 80.0–100.0)
MPV: 10.6 fL (ref 7.5–12.5)
Platelets: 318 10*3/uL (ref 140–400)
RBC: 4.26 10*6/uL (ref 3.80–5.10)
RDW: 13.6 % (ref 11.0–15.0)
WBC: 9.9 10*3/uL (ref 3.8–10.8)

## 2020-06-02 LAB — HEMOGLOBIN A1C
Hgb A1c MFr Bld: 5.4 % of total Hgb (ref <5.7)
Mean Plasma Glucose: 108 (calc)
eAG (mmol/L): 6 (calc)

## 2020-06-02 LAB — TSH: TSH: 0.94 mIU/L (ref 0.40–4.50)

## 2020-06-08 ENCOUNTER — Other Ambulatory Visit: Payer: Self-pay | Admitting: Nurse Practitioner

## 2020-06-08 DIAGNOSIS — Z1231 Encounter for screening mammogram for malignant neoplasm of breast: Secondary | ICD-10-CM

## 2020-06-15 ENCOUNTER — Other Ambulatory Visit: Payer: Self-pay

## 2020-06-15 ENCOUNTER — Other Ambulatory Visit (HOSPITAL_COMMUNITY)
Admission: RE | Admit: 2020-06-15 | Discharge: 2020-06-15 | Disposition: A | Discharge status: Home / Self Care | Payer: 59 | Source: Ambulatory Visit | Attending: Adult Health | Admitting: Adult Health

## 2020-06-15 ENCOUNTER — Encounter: Payer: Self-pay | Admitting: Adult Health

## 2020-06-15 ENCOUNTER — Ambulatory Visit (INDEPENDENT_AMBULATORY_CARE_PROVIDER_SITE_OTHER): Payer: 59 | Admitting: Adult Health

## 2020-06-15 VITALS — BP 119/59 | HR 99 | Ht 61.0 in | Wt 299.0 lb

## 2020-06-15 DIAGNOSIS — Z1211 Encounter for screening for malignant neoplasm of colon: Secondary | ICD-10-CM | POA: Diagnosis not present

## 2020-06-15 DIAGNOSIS — N939 Abnormal uterine and vaginal bleeding, unspecified: Secondary | ICD-10-CM | POA: Diagnosis not present

## 2020-06-15 DIAGNOSIS — Z01419 Encounter for gynecological examination (general) (routine) without abnormal findings: Secondary | ICD-10-CM | POA: Insufficient documentation

## 2020-06-15 LAB — HEMOCCULT GUIAC POC 1CARD (OFFICE): Fecal Occult Blood, POC: NEGATIVE

## 2020-06-15 NOTE — Progress Notes (Signed)
Patient ID: Leslie Fischer, female   DOB: Aug 01, 1966, 54 y.o.   MRN: [id number] History of Present Illness: Leslie Fischer is a 54 year old white female, widowed, G0P0, in for a pelvic and pap. Had physical and labs with Dr Karilyn Cota. She is a new pt. PCP is Dr Karilyn Cota.   Current Medications, Allergies, Past Medical History, Past Surgical History, Family History and Social History were reviewed in [employer] record.     Review of Systems: Patient denies any headaches, hearing loss, fatigue, blurred vision, shortness of breath, chest pain, abdominal pain, problems with bowel movements, urination(has stress UI), or intercourse(not currently active). No mood swings. Has knee pain, has gained weight since husband died, about 60 lbs  -she thinks had last period February 2020, and hen maybe in fall had bleeding in March,but can;t be sure on dates, husband died last year,brother has colon cancer, [place], has had hard year    Physical Exam:BP (!) 119/59 (BP Location: Left Arm, Patient Position: Sitting, Cuff Size: Large)   Pulse 99   Ht 5\' 1"  (1.549 m)   Wt 299 lb (135.6 kg)   LMP 01/24/2019 (Approximate) Comment: had bleeding march 2021  BMI 56.50 kg/m  General:  Well developed, well nourished, no acute distress Skin:  Warm and dry Lungs; Clear to auscultation bilaterally Cardiovascular: Regular rate and rhythm Pelvic:  External genitalia is normal in appearance, no lesions.  The vagina is normal in appearance. Urethra has no lesions or masses. The cervix is poorly seen, pap with high risk HPV 16/18 genotyping performed.  Uterus is felt to be normal size, shape, and contour.  No adnexal masses or tenderness noted.Bladder is non tender, no masses felt. Rectal: Good sphincter tone, no polyps, or hemorrhoids felt.  Hemoccult negative. Extremities/musculoskeletal:  No swelling or varicosities noted, no clubbing or cyanosis Psych:  No mood changes, alert and  cooperative,seems happy AA is 0 Fall risk is low. PHQ score is 0. Examination chaperoned by April 2021 LPN  Impression and Plan: 1. Encounter for gynecological examination with Papanicolaou smear of cervix Pap sent Physical with PCP Pap in 3 years if normal Mammogram tomorrow Try to lose some weight    2. Encounter for screening fecal occult blood testing Colonoscopy advised   3. Vaginal bleeding Check FSH, if elevated will get GYN Malachy Mood to assess uterus and ovaries, to rule out polyp, and endometrial cancer

## 2020-06-16 ENCOUNTER — Ambulatory Visit
Admission: RE | Admit: 2020-06-16 | Discharge: 2020-06-16 | Disposition: A | Discharge status: Home / Self Care | Payer: 59 | Source: Ambulatory Visit | Attending: Nurse Practitioner | Admitting: Nurse Practitioner

## 2020-06-16 DIAGNOSIS — Z1231 Encounter for screening mammogram for malignant neoplasm of breast: Secondary | ICD-10-CM

## 2020-06-16 LAB — FOLLICLE STIMULATING HORMONE: FSH: 8.5 m[IU]/mL

## 2020-06-21 LAB — CYTOLOGY - PAP
Comment: NEGATIVE
Diagnosis: NEGATIVE
High risk HPV: NEGATIVE

## 2020-06-23 ENCOUNTER — Other Ambulatory Visit: Payer: Self-pay | Admitting: Nurse Practitioner

## 2020-06-23 DIAGNOSIS — R928 Other abnormal and inconclusive findings on diagnostic imaging of breast: Secondary | ICD-10-CM

## 2020-06-30 ENCOUNTER — Ambulatory Visit
Admission: RE | Admit: 2020-06-30 | Discharge: 2020-06-30 | Disposition: A | Discharge status: Home / Self Care | Payer: 59 | Source: Ambulatory Visit | Attending: Nurse Practitioner | Admitting: Nurse Practitioner

## 2020-06-30 ENCOUNTER — Other Ambulatory Visit: Payer: Self-pay

## 2020-06-30 DIAGNOSIS — R928 Other abnormal and inconclusive findings on diagnostic imaging of breast: Secondary | ICD-10-CM

## 2020-07-03 ENCOUNTER — Ambulatory Visit (INDEPENDENT_AMBULATORY_CARE_PROVIDER_SITE_OTHER): Payer: 59 | Admitting: Nurse Practitioner

## 2020-07-03 ENCOUNTER — Encounter (INDEPENDENT_AMBULATORY_CARE_PROVIDER_SITE_OTHER): Payer: Self-pay | Admitting: Nurse Practitioner

## 2020-07-03 ENCOUNTER — Other Ambulatory Visit: Payer: Self-pay

## 2020-07-03 VITALS — BP 140/95 | HR 116 | Temp 96.2°F | Ht 61.0 in | Wt 290.4 lb

## 2020-07-03 DIAGNOSIS — L989 Disorder of the skin and subcutaneous tissue, unspecified: Secondary | ICD-10-CM

## 2020-07-03 DIAGNOSIS — Z23 Encounter for immunization: Secondary | ICD-10-CM

## 2020-07-03 DIAGNOSIS — Z6841 Body Mass Index (BMI) 40.0 and over, adult: Secondary | ICD-10-CM

## 2020-07-03 DIAGNOSIS — Z1159 Encounter for screening for other viral diseases: Secondary | ICD-10-CM | POA: Diagnosis not present

## 2020-07-03 DIAGNOSIS — E785 Hyperlipidemia, unspecified: Secondary | ICD-10-CM

## 2020-07-03 DIAGNOSIS — R030 Elevated blood-pressure reading, without diagnosis of hypertension: Secondary | ICD-10-CM

## 2020-07-03 NOTE — Patient Instructions (Signed)
[facility] Dietary Recommendations for Weight Loss What to Avoid . Avoid added sugars o Often added sugar can be found in processed foods such as many condiments, dry cereals, cakes, cookies, chips, crisps, crackers, candies, sweetened drinks, etc.  o Read labels and AVOID/DECREASE use of foods with the following in their ingredient list: Sugar, fructose, high fructose corn syrup, sucrose, glucose, maltose, dextrose, molasses, cane sugar, brown sugar, any type of syrup, agave nectar, etc.   . Avoid snacking in between meals . Avoid foods made with flour o If you are going to eat food made with flour, choose those made with whole-grains; and, minimize your consumption as much as is tolerable . Avoid processed foods o These foods are generally stocked in the middle of the grocery store. Focus on shopping on the perimeter of the grocery.  . Avoid Meat  o We recommend following a plant-based diet at [facility]. Thus, we recommend avoiding meat as a general rule. Consider eating beans, legumes, eggs, and/or dairy products for regular protein sources o If you plan on eating meat limit to 4 ounces of meat at a time and choose lean options such as Fish, chicken, turkey. Avoid red meat intake such as pork and/or steak What to Include . Vegetables o GREEN LEAFY VEGETABLES: Kale, spinach, mustard greens, collard greens, cabbage, broccoli, etc. o OTHER: Asparagus, cauliflower, eggplant, carrots, peas, Brussel sprouts, tomatoes, bell peppers, zucchini, beets, cucumbers, etc. . Grains, seeds, and legumes o Beans: kidney beans, black eyed peas, garbanzo beans, black beans, pinto beans, etc. o Whole, unrefined grains: brown rice, barley, bulgur, oatmeal, etc. . Healthy fats  o Avoid highly processed fats such as vegetable oil o Examples of healthy fats: avocado, olives, virgin olive oil, dark chocolate (?72% Cocoa), nuts (peanuts, almonds, walnuts, cashews, pecans, etc.) . None to Low  Intake of Animal Sources of Protein o Meat sources: chicken, turkey, salmon, tuna. Limit to 4 ounces of meat at one time. o Consider limiting dairy sources, but when choosing dairy focus on: PLAIN Greek yogurt, cottage cheese, high-protein milk . Fruit o Choose berries  When to Eat . Intermittent Fasting: o Choosing not to eat for a specific time period, but DO FOCUS ON HYDRATION when fasting o Multiple Techniques: - Time Restricted Eating: eat 3 meals in a day, each meal lasting no more than 60 minutes, no snacks between meals - 16-18 hour fast: fast for 16 to 18 hours up to 7 days a week. Often suggested to start with 2-3 nonconsecutive days per week.  . Remember the time you sleep is counted as fasting.  . Examples of eating schedule: Fast from 7:00pm-11:00am. Eat between 11:00am-7:00pm.  - 24-hour fast: fast for 24 hours up to every other day. Often suggested to start with 1 day per week . Remember the time you sleep is counted as fasting . Examples of eating schedule:  o Eating day: eat 2-3 meals on your eating day. If doing 2 meals, each meal should last no more than 90 minutes. If doing 3 meals, each meal should last no more than 60 minutes. Finish last meal by 7:00pm. o Fasting day: Fast until 7:00pm.  o IF YOU FEEL UNWELL FOR ANY REASON/IN ANY WAY WHEN FASTING, STOP FASTING BY EATING A NUTRITIOUS SNACK OR LIGHT MEAL o ALWAYS FOCUS ON HYDRATION DURING FASTS - Acceptable Hydration sources: water, broths, tea/coffee (black tea/coffee is best but using a small amount of whole-fat dairy products in coffee/tea is acceptable).  -   Poor Hydration Sources: anything with sugar or artificial sweeteners added to it  These recommendations have been developed for patients that are actively receiving medical care from either Dr. Gosrani or Dontavion Noxon, DNP, NP-C at [facility]. These recommendations are developed for patients with specific medical conditions and are not meant to be  distributed or used by others that are not actively receiving care from either provider listed above at [facility]. It is not appropriate to participate in the above eating plans without proper medical supervision.   Reference: Fung, J. The obesity code. [place]: [place]; 2016.   

## 2020-07-03 NOTE — Progress Notes (Signed)
Subjective:  Patient ID: Leslie Fischer, female    DOB: 1966/01/30  Age: 54 y.o. MRN: [id number]  CC:  Chief Complaint  Patient presents with  . Hypertension  . Obesity  . Hyperlipidemia      HPI  This patient comes in today for the above.  Her last office visit she was establishing care and blood work was done at that time.  It did show that she has hyperlipidemia.  We did briefly discuss benefits of following a plant focused diet and I did give her handout regarding this as well as information on intermittent fasting.  Since her last office visit she has made some changes to her diet including increasing her intake of greens, high-fiber wraps, and eating a cooking without any oils.  She tells me she has lost approximately 9 pound since her last office visit.  As far as health maintenance goes, she is due for tetanus shot.  She just underwent Pap smear, breast cancer screening.  She is due for colon cancer screening and hepatitis C screening.  She also mentions to me today that she has a skin lesion on her left cheek.  She does have a history of skin cancer and she feels this may be recurrence of squamous cell carcinoma.  She would like to be referred to dermatology today.   Past Medical History:  Diagnosis Date  . Arthritis   . Headache(784.0)   . IBS (irritable bowel syndrome)   . No pertinent past medical history       Family History  Problem Relation Age of Onset  . Hypertension Mother   . Cancer Father   . Aneurysm Maternal Grandfather   . Hypertension Brother   . Hypertension Sister   . Thyroid nodules Sister   . Aneurysm Maternal Aunt   . Hypertension Brother   . Cancer Brother   . Other Brother        desert storm issues  . Hypertension Brother   . Other Sister        suicide  . Breast cancer Sister     Social History   Social History Narrative  . Not on file   Social History   Tobacco Use  . Smoking status: Never Smoker  . Smokeless  tobacco: Never Used  Substance Use Topics  . Alcohol use: No     Current Meds  Medication Sig  . acetaminophen (TYLENOL) 500 MG tablet Take 500 mg by mouth every 6 (six) hours as needed for mild pain. As needed.  . Calcium Carb-Cholecalciferol (CALTRATE 600+D3) 600-800 MG-UNIT TABS Take by mouth.  . diphenhydrAMINE (BENADRYL) 25 MG tablet Take 25 mg by mouth every 6 (six) hours as needed for allergies.  Marland Kitchen doxycycline (DORYX) 100 MG EC tablet Take 100 mg by mouth as needed.   . loratadine (CLARITIN) 10 MG tablet Take 10 mg by mouth daily.  . Multiple Vitamin (MULTIVITAMIN WITH MINERALS) TABS tablet Take 1 tablet by mouth daily.  . naproxen sodium (ALEVE) 220 MG tablet Take 220 mg by mouth.  . Probiotic Product (TRUBIOTICS PO) Take by mouth.  . zinc gluconate 50 MG tablet Take 50 mg by mouth daily.    ROS:  Review of Systems  Constitutional: Negative.   Respiratory: Negative.   Cardiovascular: Negative.   Neurological: Negative.      Objective:   Today's Vitals: BP (!) 140/95 (BP Location: Left Arm, Patient Position: Sitting, Cuff Size: Normal)   Pulse (!) 116  Temp (!) 96.2 F (35.7 C) (Temporal)   Ht 5\' 1"  (1.549 m)   Wt 290 lb 6.4 oz (131.7 kg)   LMP 01/24/2019 (Approximate) Comment: had bleeding march 2021  SpO2 96%   BMI 54.87 kg/m  Vitals with BMI 07/03/2020 06/15/2020 06/01/2020  Height 5\' 1"  5\' 1"  5\' 2"   Weight 290 lbs 6 oz 299 lbs 229 lbs 6 oz  BMI 54.9 56.52 41.95  Systolic 140 119 06/03/2020  Diastolic 95 59 89  Pulse 116 99 89     Physical Exam Vitals reviewed.  Constitutional:      General: She is not in acute distress.    Appearance: Normal appearance.  HENT:     Head: Normocephalic and atraumatic.  Neck:     Vascular: No carotid bruit.  Cardiovascular:     Rate and Rhythm: Normal rate and regular rhythm.     Pulses: Normal pulses.     Heart sounds: Normal heart sounds.  Pulmonary:     Effort: Pulmonary effort is normal.     Breath sounds: Normal  breath sounds.  Skin:    General: Skin is warm and dry.       Neurological:     General: No focal deficit present.     Mental Status: She is alert and oriented to person, place, and time.  Psychiatric:        Mood and Affect: Mood normal.        Behavior: Behavior normal.        Judgment: Judgment normal.          Assessment and Plan   1. Skin lesion   2. Need for tetanus booster   3. Need for hepatitis C screening test   4. Class 3 severe obesity without serious comorbidity with body mass index (BMI) of 40.0 to 44.9 in adult, unspecified obesity type ([facility])   5. Hyperlipidemia, unspecified hyperlipidemia type   6. Elevated BP without diagnosis of hypertension      Plan: 1.  I will send referral to dermatology for further evaluation of the lesion on her cheek.  2-3.  We will administer tetanus shot today and do blood test for screening for hepatitis C.  She is not ready to undergo colonoscopy.  She does have family history of colon cancer, thus I do not believe other screening options would be appropriate for her at this time.  I did reinforce this with her and recommended that she let me know when she is ready I will refer her to gastroenterology.  She tells me she understands.  4.-6.  I again discussed plant focused eating and we discussed intermittent fasting.  She is going to try to do a 16-hour fast up to 7 days a week as she is able to tolerate it.  She will follow-up in approximately 3 months to see how she is doing with the fasting to help control her blood pressure, hyperlipidemia, and help with weight loss.     Tests ordered Orders Placed This Encounter  Procedures  . Tdap vaccine greater than or equal to 7yo IM  . Hep C Antibody  . Ambulatory referral to Dermatology      No orders of the defined types were placed in this encounter.   Patient to follow-up in 3 months for annual physical exam  , NP

## 2020-07-04 LAB — HEPATITIS C ANTIBODY
Hepatitis C Ab: NONREACTIVE
SIGNAL TO CUT-OFF: 0 (ref <1.00)

## 2020-10-03 ENCOUNTER — Encounter (INDEPENDENT_AMBULATORY_CARE_PROVIDER_SITE_OTHER): Payer: 59 | Admitting: Internal Medicine

## 2020-10-23 ENCOUNTER — Encounter (INDEPENDENT_AMBULATORY_CARE_PROVIDER_SITE_OTHER): Payer: 59 | Admitting: Internal Medicine

## 2020-11-15 ENCOUNTER — Telehealth (INDEPENDENT_AMBULATORY_CARE_PROVIDER_SITE_OTHER): Payer: Self-pay | Admitting: Internal Medicine

## 2020-11-15 ENCOUNTER — Telehealth (INDEPENDENT_AMBULATORY_CARE_PROVIDER_SITE_OTHER): Payer: 59 | Admitting: Internal Medicine

## 2020-11-15 ENCOUNTER — Encounter (INDEPENDENT_AMBULATORY_CARE_PROVIDER_SITE_OTHER): Payer: Self-pay | Admitting: Internal Medicine

## 2020-11-15 DIAGNOSIS — R0600 Dyspnea, unspecified: Secondary | ICD-10-CM

## 2020-11-15 DIAGNOSIS — R5381 Other malaise: Secondary | ICD-10-CM | POA: Diagnosis not present

## 2020-11-15 DIAGNOSIS — R5383 Other fatigue: Secondary | ICD-10-CM

## 2020-11-15 DIAGNOSIS — R509 Fever, unspecified: Secondary | ICD-10-CM | POA: Diagnosis not present

## 2020-11-15 DIAGNOSIS — R059 Cough, unspecified: Secondary | ICD-10-CM

## 2020-11-15 NOTE — Progress Notes (Signed)
Metrics: Intervention Frequency ACO  Documented Smoking Status Yearly  Screened one or more times in 24 months  Cessation Counseling or  Active cessation medication Past 24 months  Past 24 months   Guideline developer: UpToDate (See UpToDate for funding source) Date Released: 2014       Wellness Office Visit  Subjective:  Patient ID: Leslie Fischer, female    DOB: 09-16-1966  Age: 54 y.o. MRN: [id number]  CC: This is an audio telemedicine with the permission of the patient who is at home and I am in my office.  I used 2 identifiers to identify the patient. Fever, cough, malaise. HPI  The patient has the above symptoms for the last 4 days.  She apparently had been in contact with someone who had been in contact with someone who had COVID-19 disease.  The patient is fully vaccinated with COVID-19 vaccine. She feels very tired.  She does describe dyspnea on minimal exertion.  She has a dry cough.  She has had subjective fevers and one temperature was 100.4 Fahrenheit.  She did have COVID-19 test that was done 2 days ago and she is awaiting the result. Past Medical History:  Diagnosis Date  . Arthritis   . Headache(784.0)   . IBS (irritable bowel syndrome)   . No pertinent past medical history    Past Surgical History:  Procedure Laterality Date  . CHOLECYSTECTOMY    . KNEE ARTHROSCOPY  1/12   rt   . KNEE ARTHROSCOPY  2007   lt      Family History  Problem Relation Age of Onset  . Hypertension Mother   . Cancer Father   . Aneurysm Maternal Grandfather   . Hypertension Brother   . Hypertension Sister   . Thyroid nodules Sister   . Aneurysm Maternal Aunt   . Hypertension Brother   . Cancer Brother   . Other Brother        desert storm issues  . Hypertension Brother   . Other Sister        suicide  . Breast cancer Sister     Social History   Social History Narrative   Widow.   Social History   Tobacco Use  . Smoking status: Never Smoker  . Smokeless  tobacco: Never Used  Substance Use Topics  . Alcohol use: No    Current Meds  Medication Sig  . acetaminophen (TYLENOL) 500 MG tablet Take 500 mg by mouth every 6 (six) hours as needed for mild pain. As needed.  . Calcium Carb-Cholecalciferol (CALTRATE 600+D3) 600-800 MG-UNIT TABS Take by mouth.  . diphenhydrAMINE (BENADRYL) 25 MG tablet Take 25 mg by mouth every 6 (six) hours as needed for allergies.  Marland Kitchen doxycycline (DORYX) 100 MG EC tablet Take 100 mg by mouth as needed.   . loratadine (CLARITIN) 10 MG tablet Take 10 mg by mouth daily.  . Multiple Vitamin (MULTIVITAMIN WITH MINERALS) TABS tablet Take 1 tablet by mouth daily.  . naproxen sodium (ALEVE) 220 MG tablet Take 220 mg by mouth.  . Probiotic Product (TRUBIOTICS PO) Take by mouth.  . zinc gluconate 50 MG tablet Take 50 mg by mouth daily.      Depression screen [facility] 2/9 06/15/2020 06/01/2020  Decreased Interest 0 0  Down, Depressed, Hopeless 0 0  PHQ - 2 Score 0 0  Altered sleeping 0 -  Tired, decreased energy 0 -  Change in appetite 0 -  Feeling bad or failure about yourself  0 -  Trouble concentrating 0 -  Moving slowly or fidgety/restless 0 -  Suicidal thoughts 0 -  PHQ-9 Score 0 -     Objective:   Today's Vitals: LMP 01/24/2019 (Approximate) Comment: had bleeding march 2021 Vitals with BMI 11/15/2020 07/03/2020 06/15/2020  Height (No Data) 5\' 1"  5\' 1"   Weight (No Data) 290 lbs 6 oz 299 lbs  BMI - 54.9 56.52  Systolic (No Data) 140 119  Diastolic (No Data) 95 59  Pulse - 116 99     Physical Exam  She appears alert and orientated on the phone and does not appear to be dyspneic.     Assessment   1. Fever, unspecified fever cause   2. Cough   3. Malaise and fatigue   4. Dyspnea, unspecified type       Tests ordered No orders of the defined types were placed in this encounter.    Plan: 1. I recommended that the patient wait for the COVID-19 test.  If she is positive she will contact the monoclonal  infusion therapy center in [place] and I have given her the phone number for this.  She is likely a candidate for monoclonal antibody infusion. 2. If her symptoms get worse especially if her breathing gets worse, I recommended that she go to the emergency room. 3. In the meantime, she needs to drink plenty of water/fluids, vitamin D3 5000 units daily and vitamin C that she has at home. 4. Diagnostic possibilities include COVID-19 disease, influenza or other viral illnesses as well as possible bacterial illnesses. 5. This phone call lasted 11 minutes and 3 seconds.   No orders of the defined types were placed in this encounter.   , MD

## 2020-11-15 NOTE — Telephone Encounter (Signed)
Okay, that is what I thought!  I am glad she called the infusion center.  Thanks.

## 2020-11-16 ENCOUNTER — Other Ambulatory Visit (HOSPITAL_COMMUNITY): Payer: Self-pay | Admitting: Family

## 2020-11-16 DIAGNOSIS — U071 COVID-19: Secondary | ICD-10-CM

## 2020-11-16 NOTE — Progress Notes (Signed)
I connected by phone with Leslie Fischer on 11/16/2020 at 7:56 PM to discuss the potential use of a new treatment for mild to moderate COVID-19 viral infection in non-hospitalized patients.  This patient is a 54 y.o. female that meets the FDA criteria for Emergency Use Authorization of COVID monoclonal antibody casirivimab/imdevimab, bamlanivimab/eteseviamb, or sotrovimab.  Has a (+) direct SARS-CoV-2 viral test result  Has mild or moderate COVID-19   Is NOT hospitalized due to COVID-19  Is within 10 days of symptom onset  Has at least one of the high risk factor(s) for progression to severe COVID-19 and/or hospitalization as defined in EUA.  Specific high risk criteria : BMI > 25   Symptoms of cough, aches, fatigue began 11/12/20.   I have spoken and communicated the following to the patient or parent/caregiver regarding COVID monoclonal antibody treatment:  1. FDA has authorized the emergency use for the treatment of mild to moderate COVID-19 in adults and pediatric patients with positive results of direct SARS-CoV-2 viral testing who are 70 years of age and older weighing at least 40 kg, and who are at high risk for progressing to severe COVID-19 and/or hospitalization.  2. The significant known and potential risks and benefits of COVID monoclonal antibody, and the extent to which such potential risks and benefits are unknown.  3. Information on available alternative treatments and the risks and benefits of those alternatives, including clinical trials.  4. Patients treated with COVID monoclonal antibody should continue to self-isolate and use infection control measures (e.g., wear mask, isolate, social distance, avoid sharing personal items, clean and disinfect high touch surfaces, and frequent handwashing) according to CDC guidelines.   5. The patient or parent/caregiver has the option to accept or refuse COVID monoclonal antibody treatment.  After reviewing this information  with the patient, the patient has agreed to receive one of the available covid 19 monoclonal antibodies and will be provided an appropriate fact sheet prior to infusion. Morton Stall, NP 11/16/2020 7:56 PM

## 2020-11-18 ENCOUNTER — Other Ambulatory Visit (HOSPITAL_COMMUNITY): Payer: Self-pay

## 2020-11-18 ENCOUNTER — Ambulatory Visit (HOSPITAL_COMMUNITY)
Admission: RE | Admit: 2020-11-18 | Discharge: 2020-11-18 | Disposition: A | Discharge status: Home / Self Care | Payer: 59 | Source: Ambulatory Visit | Attending: Pulmonary Disease | Admitting: Pulmonary Disease

## 2020-11-18 DIAGNOSIS — U071 COVID-19: Secondary | ICD-10-CM | POA: Diagnosis present

## 2020-11-18 MED ORDER — DIPHENHYDRAMINE HCL 50 MG/ML IJ SOLN: 50.0000 mg | Freq: Once | INTRAMUSCULAR | Status: DC | PRN

## 2020-11-18 MED ORDER — METHYLPREDNISOLONE SODIUM SUCC 125 MG IJ SOLR: 125.0000 mg | Freq: Once | INTRAMUSCULAR | Status: DC | PRN

## 2020-11-18 MED ORDER — FAMOTIDINE IN NACL 20-0.9 MG/50ML-% IV SOLN: 20.0000 mg | Freq: Once | INTRAVENOUS | Status: DC | PRN

## 2020-11-18 MED ORDER — SODIUM CHLORIDE 0.9 % IV SOLN: INTRAVENOUS | Status: DC | PRN

## 2020-11-18 MED ORDER — EPINEPHRINE 0.3 MG/0.3ML IJ SOAJ: 0.3000 mg | Freq: Once | INTRAMUSCULAR | Status: DC | PRN

## 2020-11-18 MED ORDER — ALBUTEROL SULFATE HFA 108 (90 BASE) MCG/ACT IN AERS: 2.0000 | INHALATION_SPRAY | Freq: Once | RESPIRATORY_TRACT | Status: DC | PRN

## 2020-11-18 MED ORDER — SODIUM CHLORIDE 0.9 % IV SOLN: Freq: Once | INTRAVENOUS | Status: AC

## 2020-11-18 NOTE — Progress Notes (Signed)
Patient reviewed Fact Sheet for Patients, Parents, and Caregivers for Emergency Use Authorization (EUA) of Regen-COV for the Treatment of Coronavirus. Patient also reviewed and is agreeable to the estimated cost of treatment. Patient is agreeable to proceed.   

## 2020-11-18 NOTE — Discharge Instructions (Signed)
10 Things You Can Do to Manage Your COVID-19 Symptoms at Home If you have possible or confirmed COVID-19: 1. Stay home from work and school. And stay away from other public places. If you must go out, avoid using any kind of public transportation, ridesharing, or taxis. 2. Monitor your symptoms carefully. If your symptoms get worse, call your healthcare provider immediately. 3. Get rest and stay hydrated. 4. If you have a medical appointment, call the healthcare provider ahead of time and tell them that you have or may have COVID-19. 5. For medical emergencies, call 911 and notify the dispatch personnel that you have or may have COVID-19. 6. Cover your cough and sneezes with a tissue or use the inside of your elbow. 7. Wash your hands often with soap and water for at least 20 seconds or clean your hands with an alcohol-based hand sanitizer that contains at least 60% alcohol. 8. As much as possible, stay in a specific room and away from other people in your home. Also, you should use a separate bathroom, if available. If you need to be around other people in or outside of the home, wear a mask. 9. Avoid sharing personal items with other people in your household, like dishes, towels, and bedding. 10. Clean all surfaces that are touched often, like counters, tabletops, and doorknobs. Use household cleaning sprays or wipes according to the label instructions. cdc.gov/coronavirus 06/16/2019 This information is not intended to replace advice given to you by your health care provider. Make sure you discuss any questions you have with your health care provider. Document Revised: 11/18/2019 Document Reviewed: 11/18/2019 Elsevier Patient Education  2020 Elsevier Inc. What types of side effects do monoclonal antibody drugs cause?  Common side effects  In general, the more common side effects caused by monoclonal antibody drugs include: . Allergic reactions, such as hives or itching . Flu-like signs and  symptoms, including chills, fatigue, fever, and muscle aches and pains . Nausea, vomiting . Diarrhea . Skin rashes . Low blood pressure   The CDC is recommending patients who receive monoclonal antibody treatments wait at least 90 days before being vaccinated.  Currently, there are no data on the safety and efficacy of mRNA COVID-19 vaccines in persons who received monoclonal antibodies or convalescent plasma as part of COVID-19 treatment. Based on the estimated half-life of such therapies as well as evidence suggesting that reinfection is uncommon in the 90 days after initial infection, vaccination should be deferred for at least 90 days, as a precautionary measure until additional information becomes available, to avoid interference of the antibody treatment with vaccine-induced immune responses. If you have any questions or concerns after the infusion please call the Advanced Practice Provider on call at [phone]. This number is ONLY intended for your use regarding questions or concerns about the infusion post-treatment side-effects.  Please do not provide this number to others for use. For return to work notes please contact your primary care provider.   If someone you know is interested in receiving treatment please have them call the COVID hotline at [phone].   

## 2020-11-18 NOTE — Progress Notes (Signed)
  Diagnosis: COVID-19  Physician:Dr Wright   Procedure: [facility] Infusion [facility] Med: casirivimab\imdevimab infusion - Provided patient with casirivimab\imdevimab fact sheet for patients, parents and caregivers prior to infusion.  Complications: No immediate complications noted.  Discharge: Discharged home   Madeleyn Schwimmer W 11/18/2020  

## 2020-11-21 ENCOUNTER — Encounter (INDEPENDENT_AMBULATORY_CARE_PROVIDER_SITE_OTHER): Payer: 59 | Admitting: Internal Medicine

## 2020-12-04 ENCOUNTER — Telehealth (INDEPENDENT_AMBULATORY_CARE_PROVIDER_SITE_OTHER): Payer: Self-pay

## 2020-12-04 NOTE — Telephone Encounter (Signed)
Patient called and left a detailed voice message that she is still really tired from having covid and she is scheduled for a physical tomorrow and wanted to move it to next week.  I called the patient and left a detailed voice message to let her know that we are closed next week and I could move her to a later day this week with Maralyn Sago if she wants to. I advised for patient to call back to let us know what she wants to do.

## 2020-12-05 ENCOUNTER — Ambulatory Visit (INDEPENDENT_AMBULATORY_CARE_PROVIDER_SITE_OTHER): Payer: 59 | Admitting: Nurse Practitioner

## 2020-12-05 ENCOUNTER — Encounter (INDEPENDENT_AMBULATORY_CARE_PROVIDER_SITE_OTHER): Payer: Self-pay | Admitting: Nurse Practitioner

## 2020-12-05 ENCOUNTER — Other Ambulatory Visit: Payer: Self-pay

## 2020-12-05 VITALS — BP 142/92 | HR 111 | Temp 96.9°F | Ht 61.0 in | Wt 277.0 lb

## 2020-12-05 DIAGNOSIS — E785 Hyperlipidemia, unspecified: Secondary | ICD-10-CM | POA: Diagnosis not present

## 2020-12-05 DIAGNOSIS — Z0001 Encounter for general adult medical examination with abnormal findings: Secondary | ICD-10-CM

## 2020-12-05 DIAGNOSIS — Z13220 Encounter for screening for lipoid disorders: Secondary | ICD-10-CM

## 2020-12-05 DIAGNOSIS — Z6841 Body Mass Index (BMI) 40.0 and over, adult: Secondary | ICD-10-CM

## 2020-12-05 DIAGNOSIS — Z131 Encounter for screening for diabetes mellitus: Secondary | ICD-10-CM | POA: Diagnosis not present

## 2020-12-05 NOTE — Progress Notes (Signed)
Subjective:  Patient ID: Leslie Fischer, female    DOB: 07/22/1966  Age: 54 y.o. MRN: [id number]  CC:  Chief Complaint  Patient presents with  . Annual Exam    Doing better since having covid      HPI  This patient arrives today for the above.  Overall she tells me she is feeling well.  As far as immunizations go she is due for her COVID-19 booster, however she had COVID-19 infection approximately 1 month ago.  She was told to hold off on getting a booster for about 90 days from infection.  So she plans on getting this administered in March.  She is up-to-date with tetanus and flu vaccine.  She is up-to-date with Pap smear, hepatitis C screening, and mammogram.  She is not yet ready to undergo colon cancer screening.  She does have a family history of colon cancer and would be willing to do Cologuard.  I did discuss with her that Cologuard is not recommended for individuals with family history of colon cancer, and that she should really have colonoscopy completed.  She is not ready to have colonoscopy completed due to concerns with COVID-19 and having to go to the hospital for the procedure.  She has no acute complaints today.  Past Medical History:  Diagnosis Date  . Arthritis   . Headache(784.0)   . IBS (irritable bowel syndrome)   . No pertinent past medical history   . Rosacea    Face      Family History  Problem Relation Age of Onset  . Hypertension Mother   . Cancer Father   . Aneurysm Maternal Grandfather   . Hypertension Brother   . Hypertension Sister   . Thyroid nodules Sister   . Aneurysm Maternal Aunt   . Hypertension Brother   . Cancer Brother   . Other Brother        desert storm issues  . Hypertension Brother   . Other Sister        suicide  . Breast cancer Sister     Social History   Social History Narrative   Widow.   Social History   Tobacco Use  . Smoking status: Never Smoker  . Smokeless tobacco: Never Used  Substance Use Topics   . Alcohol use: No     Current Meds  Medication Sig  . acetaminophen (TYLENOL) 500 MG tablet Take 500 mg by mouth every 6 (six) hours as needed for mild pain. As needed.  . Ascorbic Acid (VITAMIN C) 1000 MG tablet Take 1,000 mg by mouth daily.  . Calcium Carb-Cholecalciferol (CALTRATE 600+D3) 600-800 MG-UNIT TABS Take by mouth.  . Cholecalciferol (VITAMIN D3) 125 MCG (5000 UT) CAPS Take 1 capsule by mouth daily.  . diphenhydrAMINE (BENADRYL) 25 MG tablet Take 25 mg by mouth every 6 (six) hours as needed for allergies.  Marland Kitchen doxycycline (ADOXA) 100 MG tablet Take 100 mg by mouth daily.  Marland Kitchen loratadine (CLARITIN) 10 MG tablet Take 10 mg by mouth daily.  . Multiple Vitamin (MULTIVITAMIN WITH MINERALS) TABS tablet Take 1 tablet by mouth daily.  . naproxen sodium (ALEVE) 220 MG tablet Take 220 mg by mouth.  . Probiotic Product (TRUBIOTICS PO) Take by mouth.  . zinc gluconate 50 MG tablet Take 50 mg by mouth daily.    ROS:  Review of Systems  Constitutional: Positive for malaise/fatigue and weight loss. Negative for fever.  Respiratory: Negative for shortness of breath.   Cardiovascular:  Negative for chest pain.  Gastrointestinal: Negative for abdominal pain.  Genitourinary: Positive for dysuria (when she had covid, but has no resolved).  Neurological: Negative for dizziness and headaches.     Objective:   Today's Vitals: BP (!) 142/92   Pulse (!) 111   Temp (!) 96.9 F (36.1 C) (Temporal)   Ht 5' 1"  (1.549 m)   Wt 277 lb (125.6 kg)   LMP 01/24/2019 (Approximate) Comment: had bleeding march 2021  SpO2 93%   BMI 52.34 kg/m  Vitals with BMI 12/05/2020 11/18/2020 11/18/2020  Height 5' 1"  - -  Weight 277 lbs - -  BMI 97.02 - -  Systolic 637 858 850  Diastolic 92 82 77  Pulse 277 86 96     Physical Exam Vitals reviewed.  Constitutional:      Appearance: Normal appearance.  HENT:     Head: Normocephalic and atraumatic.     Right Ear: Tympanic membrane, ear canal and external  ear normal.     Left Ear: Tympanic membrane, ear canal and external ear normal.  Eyes:     General:        Right eye: No discharge.        Left eye: No discharge.     Extraocular Movements: Extraocular movements intact.     Conjunctiva/sclera: Conjunctivae normal.     Pupils: Pupils are equal, round, and reactive to light.  Neck:     Vascular: No carotid bruit.  Cardiovascular:     Rate and Rhythm: Normal rate and regular rhythm.     Pulses: Normal pulses.     Heart sounds: Normal heart sounds. No murmur heard.   Pulmonary:     Effort: Pulmonary effort is normal.     Breath sounds: Normal breath sounds.  Chest:  Breasts:     Right: No supraclavicular adenopathy.     Left: No supraclavicular adenopathy.      Comments: Breast exam not completed per patient preference. Abdominal:     General: Abdomen is flat. Bowel sounds are normal. There is no distension.     Palpations: Abdomen is soft. There is no mass.     Tenderness: There is no abdominal tenderness.  Musculoskeletal:        General: No tenderness.     Cervical back: Neck supple. No muscular tenderness.     Right lower leg: No edema.     Left lower leg: No edema.  Lymphadenopathy:     Cervical: No cervical adenopathy.     Upper Body:     Right upper body: No supraclavicular adenopathy.     Left upper body: No supraclavicular adenopathy.  Skin:    General: Skin is warm and dry.  Neurological:     General: No focal deficit present.     Mental Status: She is alert and oriented to person, place, and time.     Motor: No weakness.     Gait: Gait normal.  Psychiatric:        Mood and Affect: Mood normal.        Behavior: Behavior normal.        Judgment: Judgment normal.          Assessment and Plan   1. Encounter for general adult medical examination with abnormal findings   2. Hyperlipidemia, unspecified hyperlipidemia type   3. Screening for diabetes mellitus   4. Screening for lipid disorders   5.  Class 3 severe obesity with body mass index (BMI) of  50.0 to 59.9 in adult, unspecified obesity type, unspecified whether serious comorbidity present ([place])      Plan: 1.  I encouraged her to get her COVID-19 booster shot in early March 2020.  She plans on doing so.  I also encouraged her to let me know when she is ready to undergo surveillance colonoscopy, and tells me she will let me know. 2.-5.  We will check blood work today for further evaluation.   Tests ordered Orders Placed This Encounter  Procedures  . Urinalysis with Culture Reflex  . CBC with Differential/Platelets  . CMP with eGFR([facility])  . Lipid Panel  . Hemoglobin A1c  . Vitamin D, 25-hydroxy  . TSH      No orders of the defined types were placed in this encounter.   Patient to follow-up in 3 months or sooner as needed  Ailene Ards, NP

## 2020-12-06 ENCOUNTER — Other Ambulatory Visit (INDEPENDENT_AMBULATORY_CARE_PROVIDER_SITE_OTHER): Payer: 59

## 2020-12-07 ENCOUNTER — Encounter (INDEPENDENT_AMBULATORY_CARE_PROVIDER_SITE_OTHER): Payer: Self-pay | Admitting: Nurse Practitioner

## 2020-12-07 ENCOUNTER — Other Ambulatory Visit (INDEPENDENT_AMBULATORY_CARE_PROVIDER_SITE_OTHER): Payer: Self-pay | Admitting: Nurse Practitioner

## 2020-12-07 DIAGNOSIS — N390 Urinary tract infection, site not specified: Secondary | ICD-10-CM

## 2020-12-07 MED ORDER — NITROFURANTOIN MONOHYD MACRO 100 MG PO CAPS: 100.0000 mg | ORAL_CAPSULE | Freq: Two times a day (BID) | ORAL | 0 refills | Status: DC

## 2020-12-07 NOTE — Progress Notes (Signed)
I have sent prescription for medication called Macrobid also known as nitrofurantoin to [facility] pharmacy.  She is to take 1 tablet by mouth twice a day for 5 days.  I will also send her a message in MyChart with some information regarding this medication.  Encourage her to read through this and if she has any questions to please let us know.  If her urine culture results come back showing that we need to change to a different antibiotic I will let her know.  Please verify that she will have a cell phone with her when she is out of town so that if I have to call her I can reach her and she can tell me which pharmacy to send it to if necessary.  Also let her know if I have to call her I will be calling for a blocked number.  Thank you.

## 2020-12-07 NOTE — Progress Notes (Signed)
Called patient and gave her the information from Maralyn Sago. Patient verbalized an understanding and stated that she will have her cell phone on her but she will not have access to the internet until she returns on 12/10/2020.

## 2020-12-09 LAB — CBC WITH DIFFERENTIAL/PLATELET
Absolute Monocytes: 641 cells/uL (ref 200–950)
Basophils Absolute: 94 cells/uL (ref 0–200)
Basophils Relative: 1.3 %
Eosinophils Absolute: 230 cells/uL (ref 15–500)
Eosinophils Relative: 3.2 %
HCT: 35.9 % (ref 35.0–45.0)
Hemoglobin: 12.2 g/dL (ref 11.7–15.5)
Lymphs Abs: 2563 cells/uL (ref 850–3900)
MCH: 29.7 pg (ref 27.0–33.0)
MCHC: 34 g/dL (ref 32.0–36.0)
MCV: 87.3 fL (ref 80.0–100.0)
MPV: 11.4 fL (ref 7.5–12.5)
Monocytes Relative: 8.9 %
Neutro Abs: 3672 cells/uL (ref 1500–7800)
Neutrophils Relative %: 51 %
Platelets: 390 10*3/uL (ref 140–400)
RBC: 4.11 10*6/uL (ref 3.80–5.10)
RDW: 13.1 % (ref 11.0–15.0)
Total Lymphocyte: 35.6 %
WBC: 7.2 10*3/uL (ref 3.8–10.8)

## 2020-12-09 LAB — LIPID PANEL
Cholesterol: 201 mg/dL — ABNORMAL HIGH (ref <200)
HDL: 49 mg/dL — ABNORMAL LOW (ref >=50)
LDL Cholesterol (Calc): 122 mg/dL (calc) — ABNORMAL HIGH
Non-HDL Cholesterol (Calc): 152 mg/dL (calc) — ABNORMAL HIGH (ref <130)
Total CHOL/HDL Ratio: 4.1 (calc) (ref <5.0)
Triglycerides: 182 mg/dL — ABNORMAL HIGH (ref <150)

## 2020-12-09 LAB — COMPLETE METABOLIC PANEL WITH GFR
AG Ratio: 1.7 (calc) (ref 1.0–2.5)
ALT: 30 U/L — ABNORMAL HIGH (ref 6–29)
AST: 20 U/L (ref 10–35)
Albumin: 4.3 g/dL (ref 3.6–5.1)
Alkaline phosphatase (APISO): 76 U/L (ref 37–153)
BUN: 23 mg/dL (ref 7–25)
CO2: 21 mmol/L (ref 20–32)
Calcium: 9.2 mg/dL (ref 8.6–10.4)
Chloride: 109 mmol/L (ref 98–110)
Creat: 0.78 mg/dL (ref 0.50–1.05)
GFR, Est African American: 98 mL/min/{1.73_m2} (ref >=60)
GFR, Est Non African American: 85 mL/min/{1.73_m2} (ref >=60)
Globulin: 2.6 g/dL (calc) (ref 1.9–3.7)
Glucose, Bld: 121 mg/dL — ABNORMAL HIGH (ref 65–99)
Potassium: 4.2 mmol/L (ref 3.5–5.3)
Sodium: 142 mmol/L (ref 135–146)
Total Bilirubin: 0.4 mg/dL (ref 0.2–1.2)
Total Protein: 6.9 g/dL (ref 6.1–8.1)

## 2020-12-09 LAB — URINALYSIS W MICROSCOPIC + REFLEX CULTURE
Bilirubin Urine: NEGATIVE
Glucose, UA: NEGATIVE
Hgb urine dipstick: NEGATIVE
Hyaline Cast: NONE SEEN /LPF
Ketones, ur: NEGATIVE
Nitrites, Initial: NEGATIVE
Protein, ur: NEGATIVE
RBC / HPF: NONE SEEN /HPF (ref 0–2)
Specific Gravity, Urine: 1.023 (ref 1.001–1.03)
pH: <=5 (ref 5.0–8.0)

## 2020-12-09 LAB — URINE CULTURE

## 2020-12-09 LAB — HEMOGLOBIN A1C
Hgb A1c MFr Bld: 6 % of total Hgb — ABNORMAL HIGH (ref <5.7)
Mean Plasma Glucose: 126 mg/dL
eAG (mmol/L): 7 mmol/L

## 2020-12-09 LAB — VITAMIN D 25 HYDROXY (VIT D DEFICIENCY, FRACTURES): Vit D, 25-Hydroxy: 36 ng/mL (ref 30–100)

## 2020-12-09 LAB — CULTURE INDICATED

## 2020-12-09 LAB — TSH: TSH: 0.69 mIU/L (ref 0.40–4.50)

## 2020-12-12 ENCOUNTER — Telehealth (INDEPENDENT_AMBULATORY_CARE_PROVIDER_SITE_OTHER): Payer: Self-pay

## 2020-12-12 ENCOUNTER — Encounter (INDEPENDENT_AMBULATORY_CARE_PROVIDER_SITE_OTHER): Payer: Self-pay | Admitting: Nurse Practitioner

## 2020-12-12 NOTE — Telephone Encounter (Signed)
Patient called and left a detailed VM and stated that she got a text message on her phone with a link to check for a prescription?  I called the patient back and let her know that nothing had been sent to her pharmacy in the past few days and she did pick up the prescription from 12/07/2020 and stated that her symptoms are much better and that she is doing well.   Patient is going to send a copy of the link through her MyChart so someone can look into this to be aware as other patients stated that they received something similar to this. Patient verbalized an understanding.

## 2021-03-05 ENCOUNTER — Ambulatory Visit (INDEPENDENT_AMBULATORY_CARE_PROVIDER_SITE_OTHER): Payer: 59 | Admitting: Internal Medicine

## 2021-03-28 ENCOUNTER — Ambulatory Visit (INDEPENDENT_AMBULATORY_CARE_PROVIDER_SITE_OTHER): Payer: 59 | Admitting: Nurse Practitioner

## 2021-04-28 IMAGING — MG MM DIGITAL DIAGNOSTIC UNILAT*L* W/ TOMO W/ CAD
4 series · 4 of 12 positions shown · non-contrast
Comparison: Previous exam(s).

CLINICAL DATA: Patient was called back from screening mammogram for
a possible mass in the left breast. Patient had skin cancer removed
from the 10 o'clock region of the breast in 1029.

EXAM:
DIGITAL DIAGNOSTIC LEFT MAMMOGRAM WITH CAD AND TOMO
ULTRASOUND LEFT BREAST

[L MLO synth-2D]
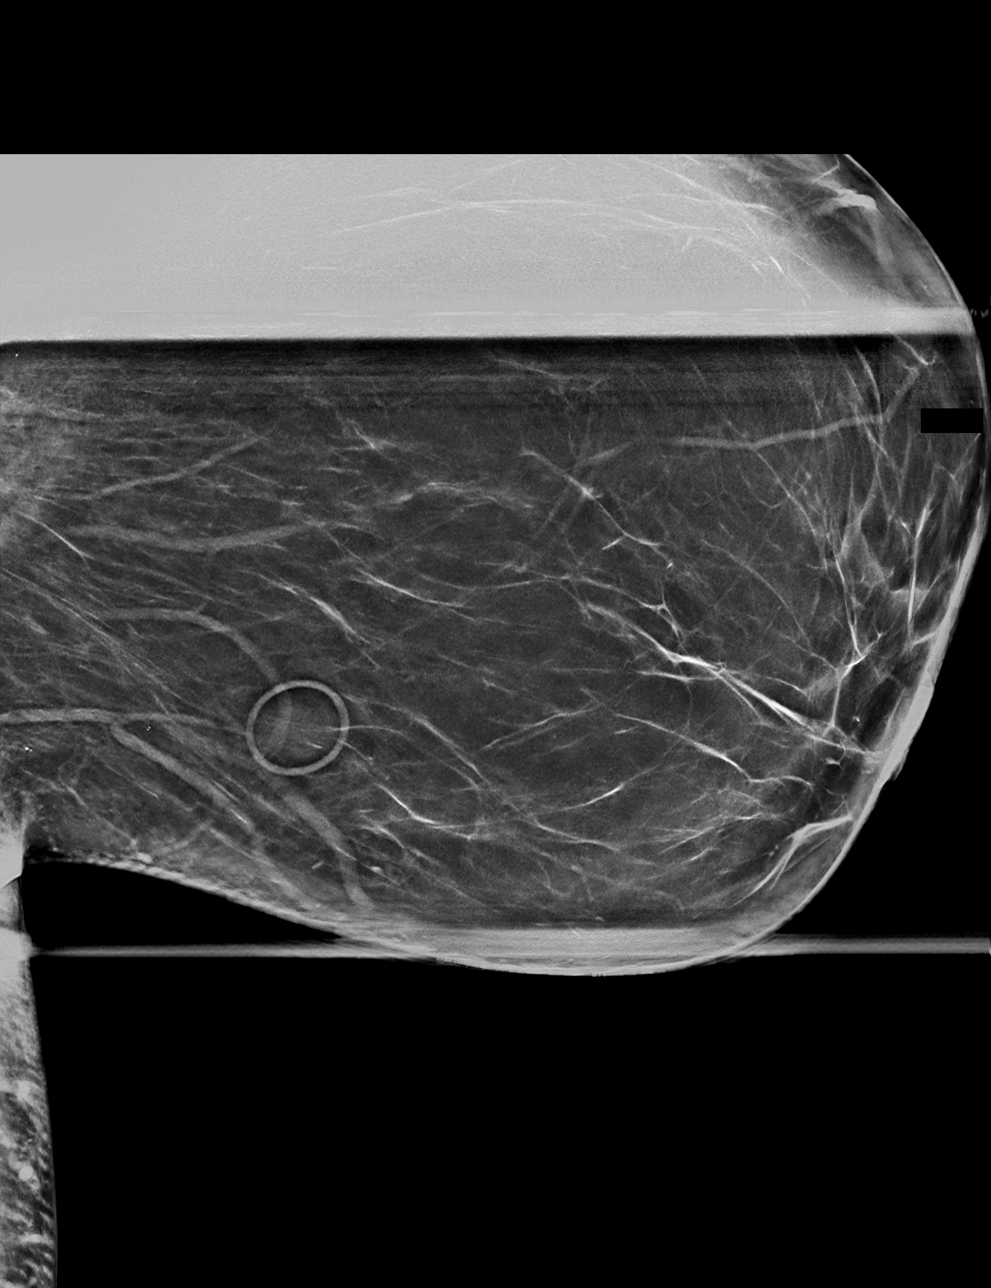

[L ML synth-2D]
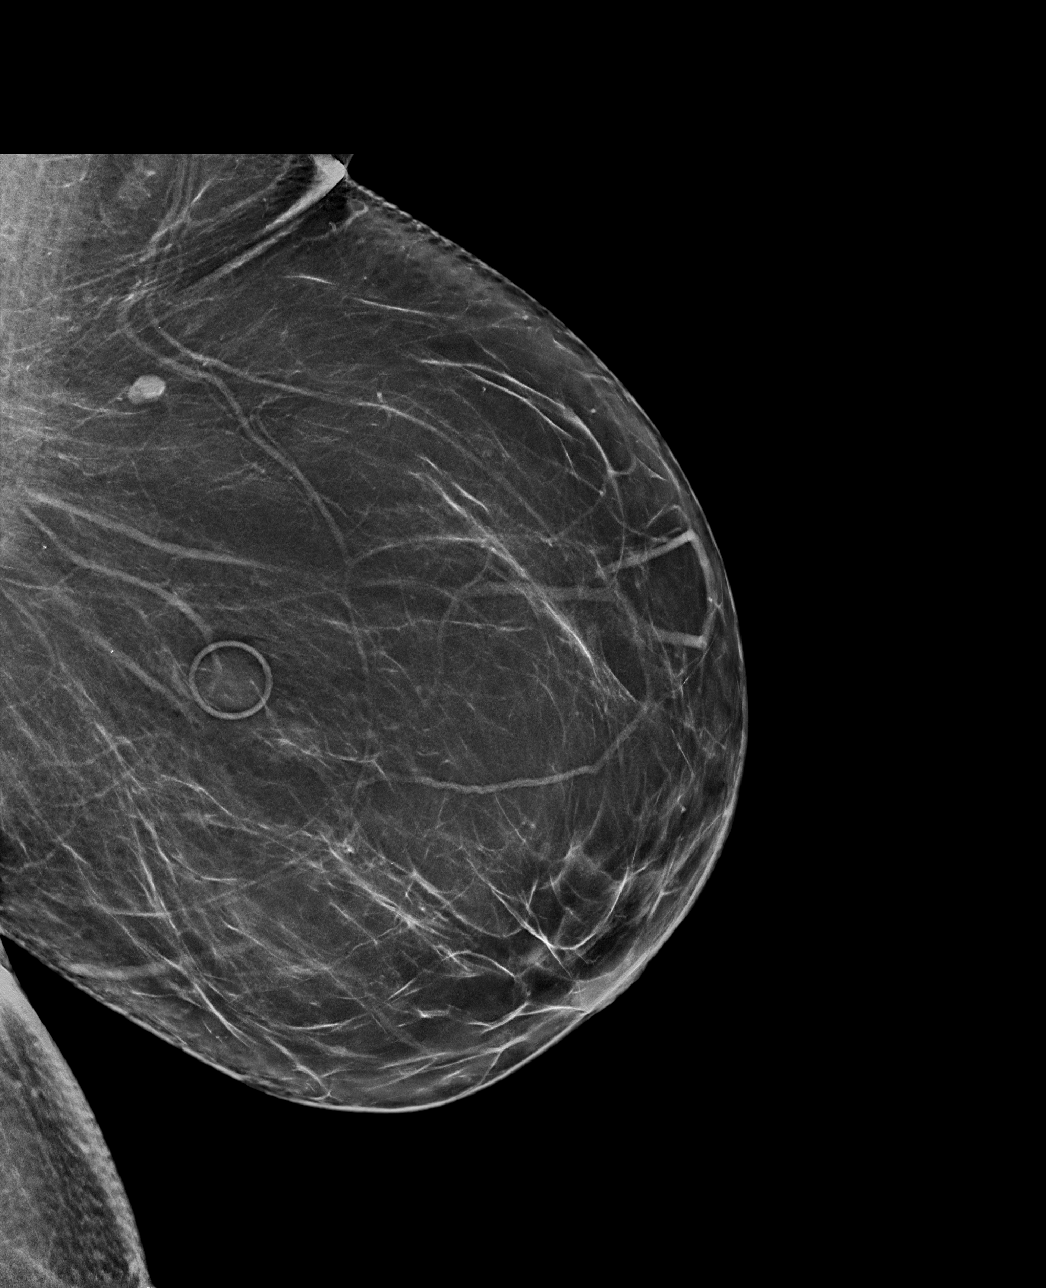

[L MLO tomo · tomo slice 39/76.0]
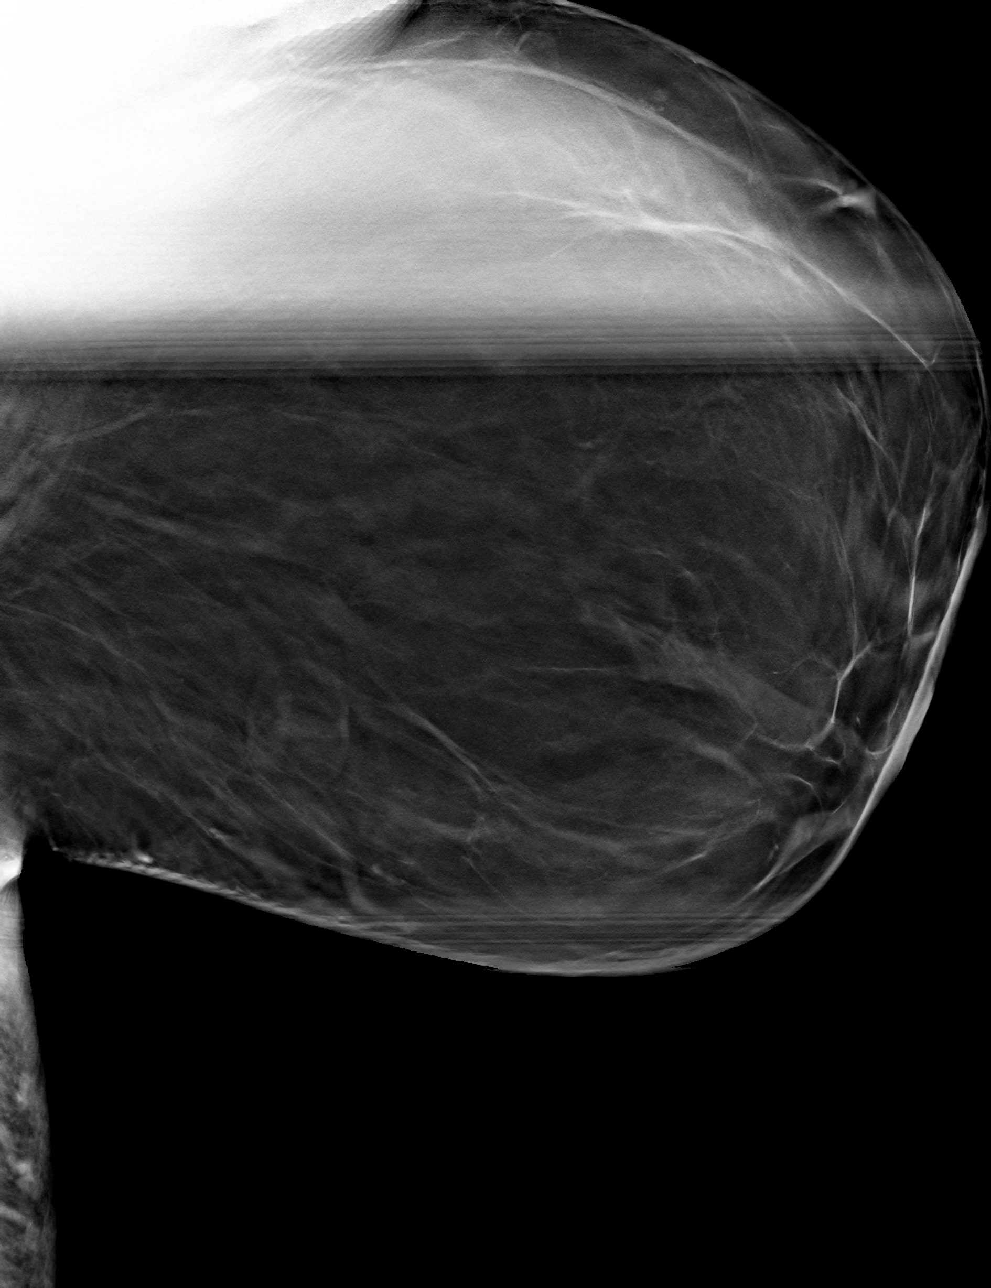

[L ML tomo · tomo slice 38/75.0]
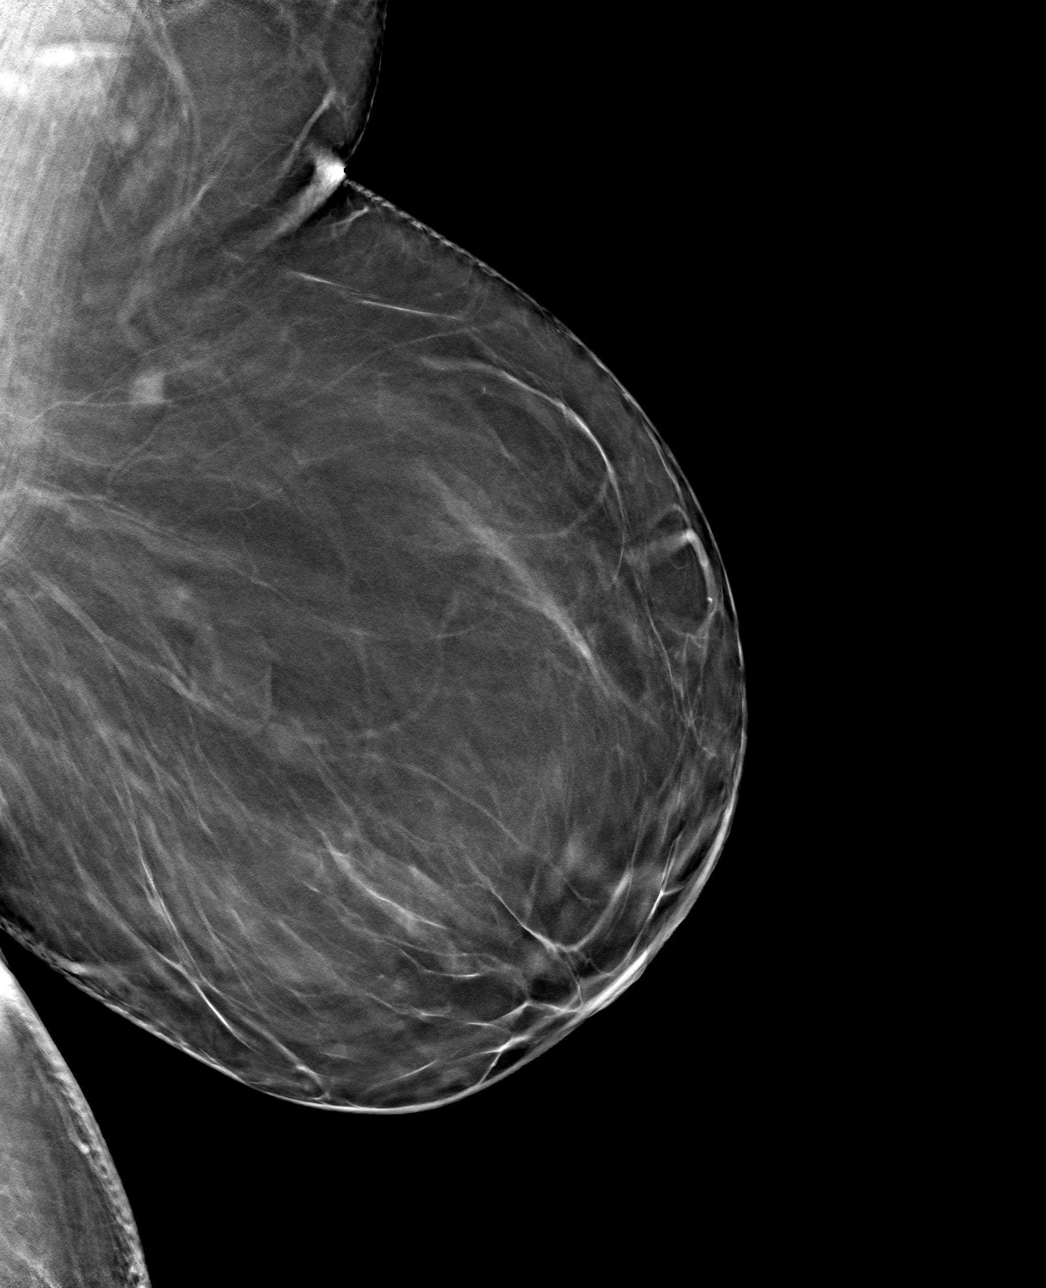

[4 of 12 positions shown; findings below may reference images not displayed]

ACR Breast Density Category b: There are scattered areas of
fibroglandular density.
FINDINGS: Skin marker was placed over the scar where the skin cancer was
removed from the 10 o'clock region of the left breast. The
mammographic abnormality corresponds with the scar where the skin
cancer was removed. No suspicious mass or malignant type
microcalcifications identified.

Mammographic images were processed with CAD.

On physical exam, there is a raised scar in the left breast at 10
o'clock 12 cm from the nipple where the skin cancer was removed.
Patient states it has been clinically stable since 1029. No
additional mass is felt in the left breast.

Targeted ultrasound is performed, showing there is a 1.0 x 0.4 x
cm hypoechoic mass in the skin of the left breast at 10 o'clock 12
cm from the nipple corresponding with the scar.
IMPRESSION: Mammographic abnormality in the left breast corresponds with a scar
from where skin cancer was resected. No evidence of malignancy in
the left breast.

RECOMMENDATION:
Bilateral screening mammogram in 1 year is recommended.

I have discussed the findings and recommendations with the patient.
If applicable, a reminder letter will be sent to the patient
regarding the next appointment.

BI-RADS CATEGORY  2: Benign.

## 2021-04-28 IMAGING — US US BREAST*L* LIMITED INC AXILLA
1 series · 6 of 6 positions shown · non-contrast
Comparison: Previous exam(s).

CLINICAL DATA: Patient was called back from screening mammogram for
a possible mass in the left breast. Patient had skin cancer removed
from the 10 o'clock region of the breast in 1029.

EXAM:
DIGITAL DIAGNOSTIC LEFT MAMMOGRAM WITH CAD AND TOMO
ULTRASOUND LEFT BREAST

[Series 1: us breast*left* limited inc axilla · 0.05mm/px · 6 of 6 slices shown]
[im 1/6]
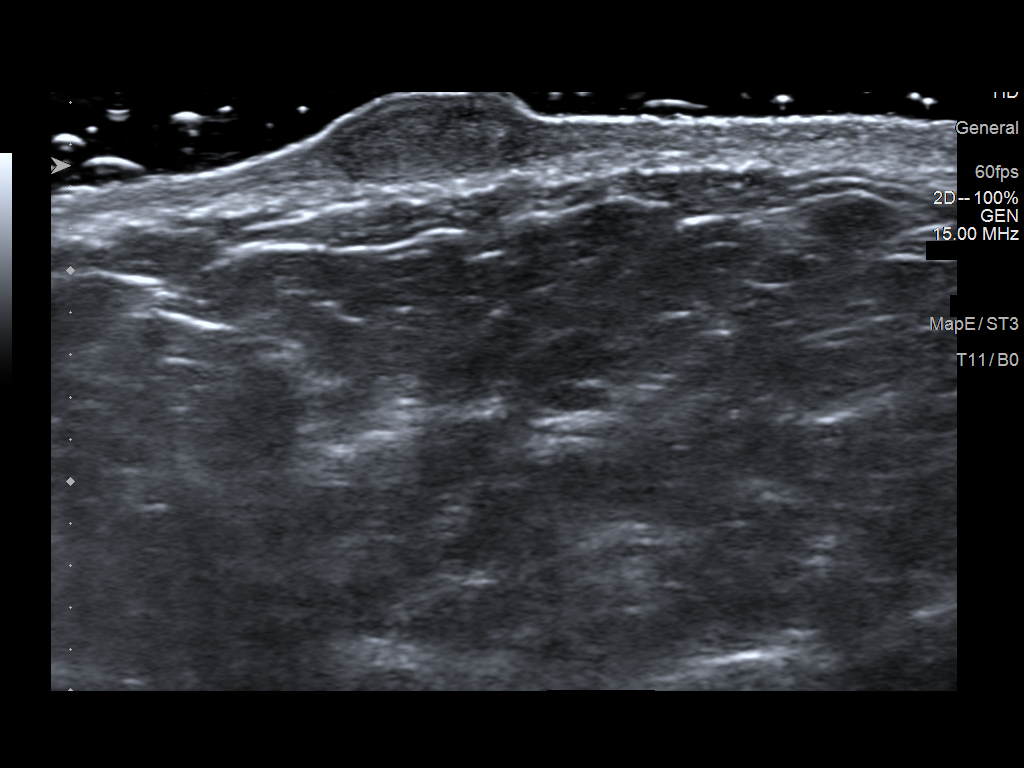
[im 2/6]
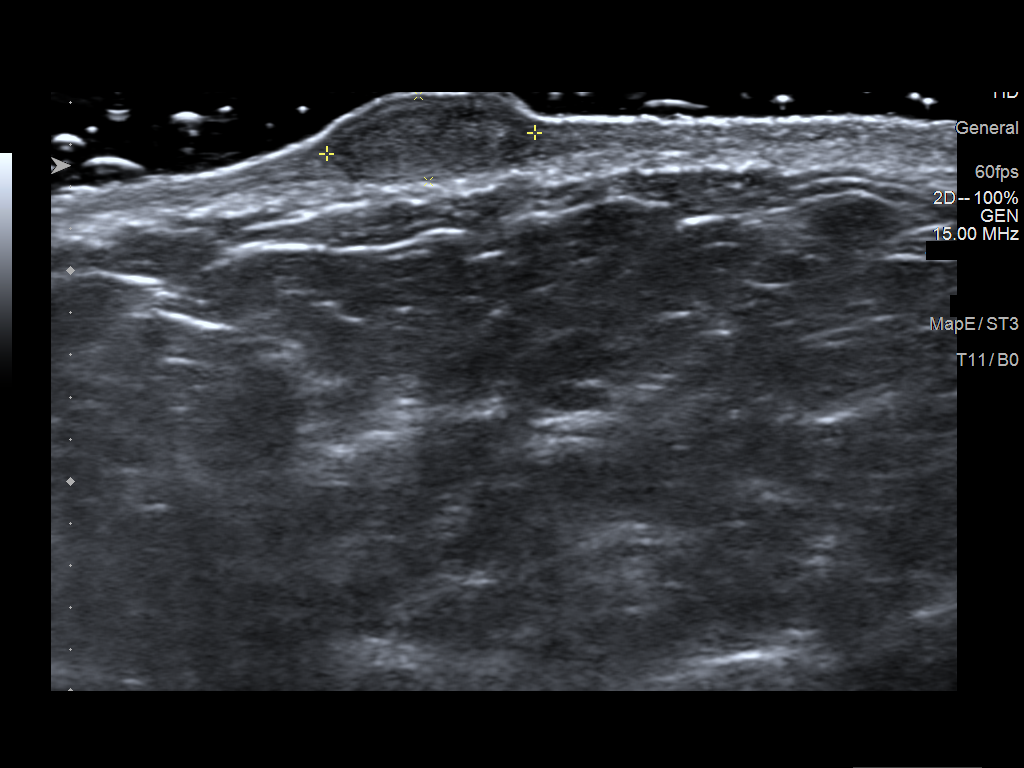
[im 3/6]
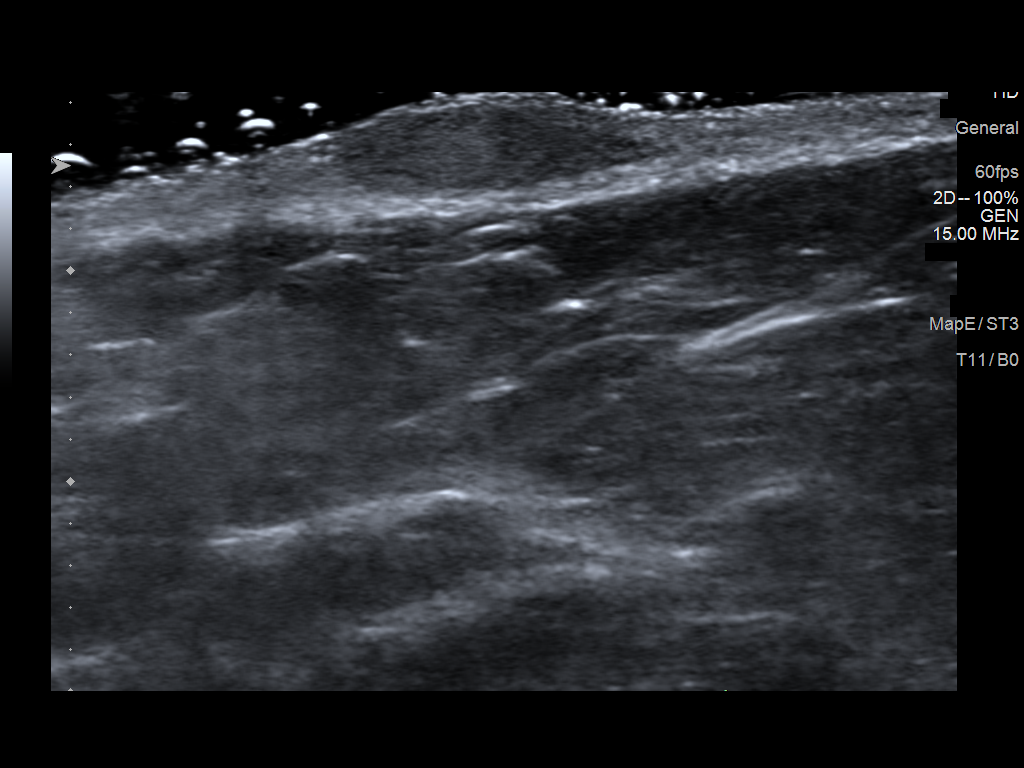
[im 4/6]
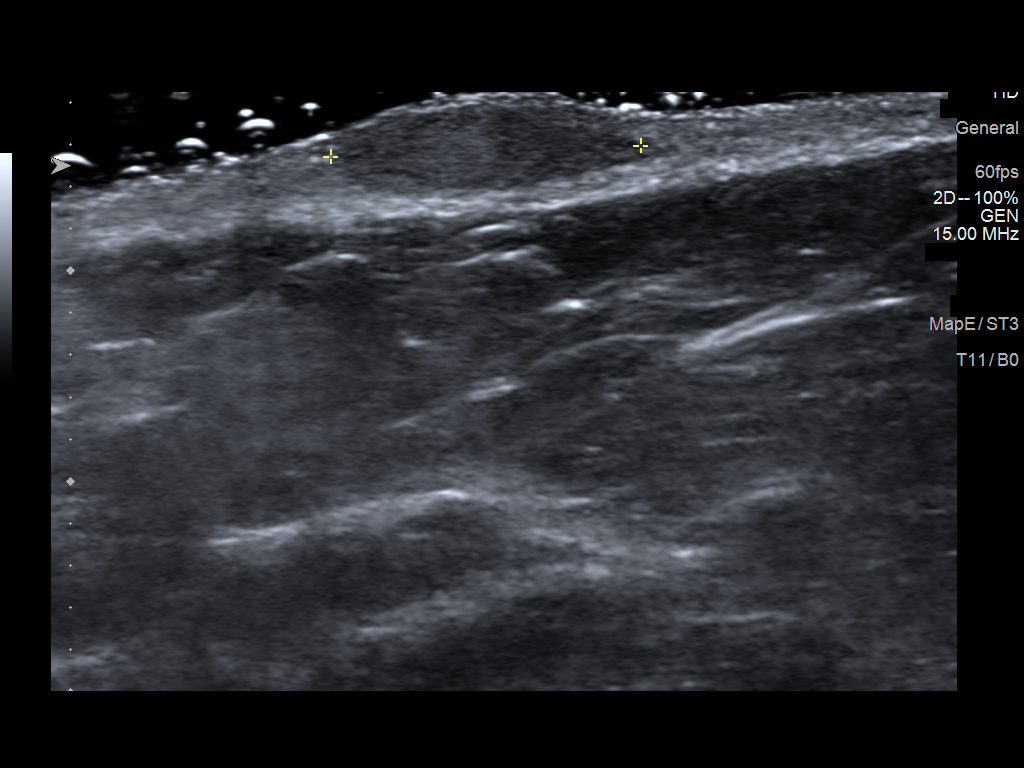
[im 5/6]
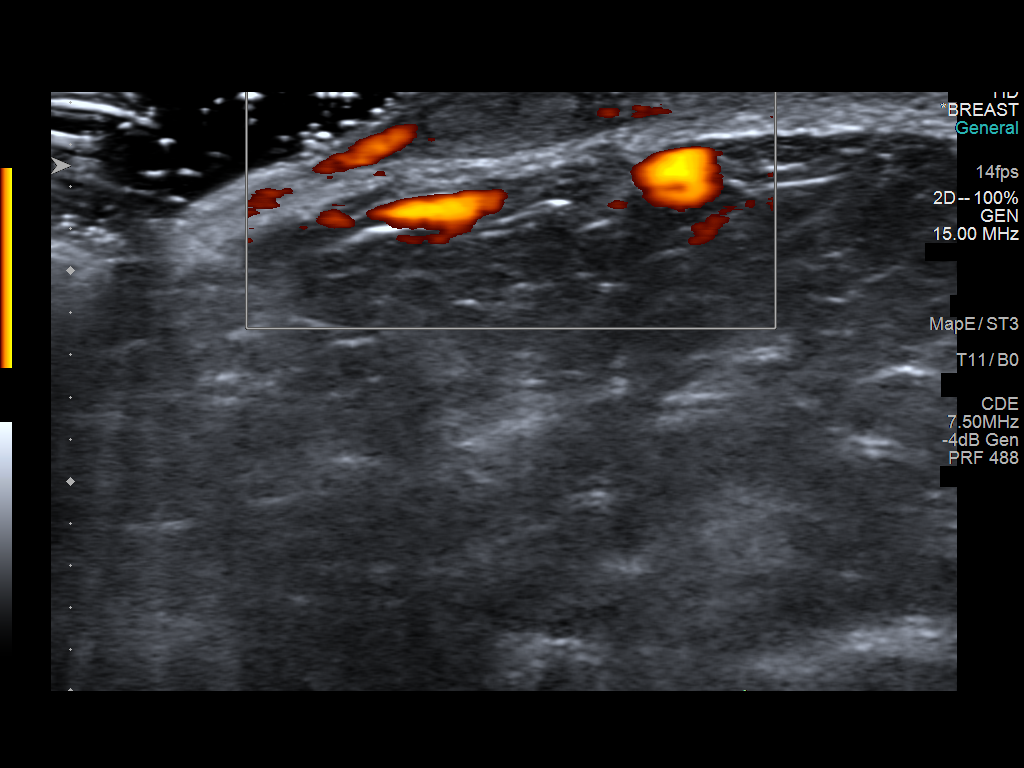
[im 6/6]
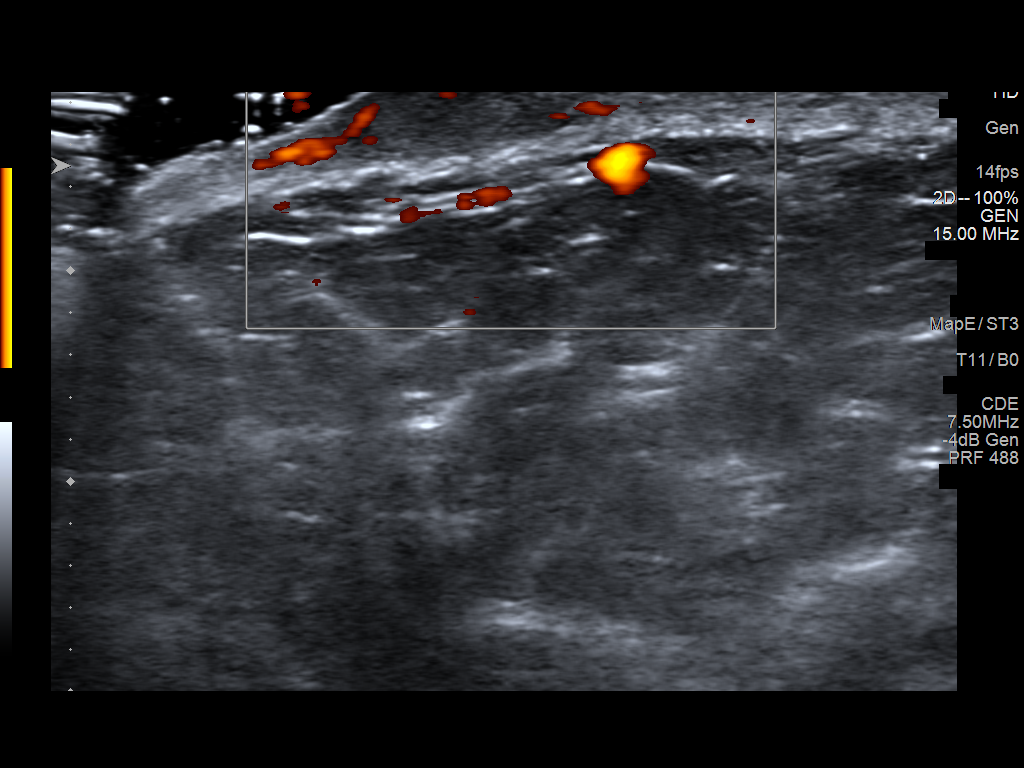

[6 of 6 positions shown; findings below may reference images not displayed]

ACR Breast Density Category b: There are scattered areas of
fibroglandular density.
FINDINGS: Skin marker was placed over the scar where the skin cancer was
removed from the 10 o'clock region of the left breast. The
mammographic abnormality corresponds with the scar where the skin
cancer was removed. No suspicious mass or malignant type
microcalcifications identified.

Mammographic images were processed with CAD.

On physical exam, there is a raised scar in the left breast at 10
o'clock 12 cm from the nipple where the skin cancer was removed.
Patient states it has been clinically stable since 1029. No
additional mass is felt in the left breast.

Targeted ultrasound is performed, showing there is a 1.0 x 0.4 x
cm hypoechoic mass in the skin of the left breast at 10 o'clock 12
cm from the nipple corresponding with the scar.
IMPRESSION: Mammographic abnormality in the left breast corresponds with a scar
from where skin cancer was resected. No evidence of malignancy in
the left breast.

RECOMMENDATION:
Bilateral screening mammogram in 1 year is recommended.

I have discussed the findings and recommendations with the patient.
If applicable, a reminder letter will be sent to the patient
regarding the next appointment.

BI-RADS CATEGORY  2: Benign.

## 2021-05-09 ENCOUNTER — Ambulatory Visit (INDEPENDENT_AMBULATORY_CARE_PROVIDER_SITE_OTHER): Payer: 59 | Admitting: Nurse Practitioner

## 2021-05-31 ENCOUNTER — Ambulatory Visit (INDEPENDENT_AMBULATORY_CARE_PROVIDER_SITE_OTHER): Payer: 59 | Admitting: Nurse Practitioner

## 2021-06-21 ENCOUNTER — Ambulatory Visit (INDEPENDENT_AMBULATORY_CARE_PROVIDER_SITE_OTHER): Payer: 59 | Admitting: Nurse Practitioner

## 2021-06-21 ENCOUNTER — Other Ambulatory Visit: Payer: Self-pay

## 2021-06-21 ENCOUNTER — Encounter (INDEPENDENT_AMBULATORY_CARE_PROVIDER_SITE_OTHER): Payer: Self-pay | Admitting: Nurse Practitioner

## 2021-06-21 VITALS — BP 122/78 | HR 105 | Temp 97.5°F | Ht 61.0 in | Wt 272.8 lb

## 2021-06-21 DIAGNOSIS — E785 Hyperlipidemia, unspecified: Secondary | ICD-10-CM

## 2021-06-21 DIAGNOSIS — R7303 Prediabetes: Secondary | ICD-10-CM | POA: Diagnosis not present

## 2021-06-21 DIAGNOSIS — Z6841 Body Mass Index (BMI) 40.0 and over, adult: Secondary | ICD-10-CM | POA: Diagnosis not present

## 2021-06-21 NOTE — Progress Notes (Signed)
Subjective:  Patient ID: Leslie Fischer, female    DOB: 29-Oct-1966  Age: 55 y.o. MRN: [id number]  CC:  Chief Complaint  Patient presents with   Follow-up    Doing okay, needs to have labs today   Obesity   Prediabetes   Hyperlipidemia      HPI  This patient arrives today for the above.  She continues to work on her lifestyle and helping her with weight loss, controlling her A1c and preventing type 2 diabetes, and improving her cholesterol.  She tells me she has made significant changes to her diet including reduction of intake of sweets, fats, and breads.  She is due to have blood work today.  She has no other concerns today.  Past Medical History:  Diagnosis Date   Arthritis    Headache(784.0)    IBS (irritable bowel syndrome)    No pertinent past medical history    Rosacea    Face      Family History  Problem Relation Age of Onset   Hypertension Mother    Cancer Father    Aneurysm Maternal Grandfather    Hypertension Brother    Hypertension Sister    Thyroid nodules Sister    Aneurysm Maternal Aunt    Hypertension Brother    Cancer Brother    Other Brother        desert storm issues   Hypertension Brother    Other Sister        suicide   Breast cancer Sister     Social History   Social History Narrative   Widow.   Social History   Tobacco Use   Smoking status: Never   Smokeless tobacco: Never  Substance Use Topics   Alcohol use: No     Current Meds  Medication Sig   acetaminophen (TYLENOL) 500 MG tablet Take 500 mg by mouth every 6 (six) hours as needed for mild pain. As needed.   Ascorbic Acid (VITAMIN C) 1000 MG tablet Take 1,000 mg by mouth daily.   Calcium Carb-Cholecalciferol (CALTRATE 600+D3) 600-800 MG-UNIT TABS Take by mouth.   Cholecalciferol 125 MCG (5000 UT) TABS Take 1 tablet by mouth. Take 1 tablet by mouth three times a week   diphenhydrAMINE (BENADRYL) 25 MG tablet Take 25 mg by mouth every 6 (six) hours as needed for  allergies.   doxycycline (ADOXA) 100 MG tablet Take 100 mg by mouth daily.   loratadine (CLARITIN) 10 MG tablet Take 10 mg by mouth daily.   Multiple Vitamin (MULTIVITAMIN WITH MINERALS) TABS tablet Take 1 tablet by mouth daily.   naproxen sodium (ALEVE) 220 MG tablet Take 220 mg by mouth.   Probiotic Product (TRUBIOTICS PO) Take by mouth.   zinc gluconate 50 MG tablet Take 50 mg by mouth daily.   [DISCONTINUED] Cholecalciferol (VITAMIN D3) 125 MCG (5000 UT) CAPS Take 1 capsule by mouth daily.    ROS:  Review of Systems  Eyes:  Negative for blurred vision.  Respiratory:  Negative for shortness of breath.   Cardiovascular:  Negative for chest pain.  Gastrointestinal:  Negative for abdominal pain and blood in stool.  Neurological:  Negative for dizziness and headaches.    Objective:   Today's Vitals: BP 122/78   Pulse (!) 105   Temp (!) 97.5 F (36.4 C) (Temporal)   Ht _0  (1.549 m)   Wt 272 lb 12.8 oz (123.7 kg)   LMP 01/24/2019 (Approximate) Comment: had bleeding march 2021  SpO2 96%   BMI 51.55 kg/m  Vitals with BMI 06/21/2021 12/05/2020 11/18/2020  Height _0  _1  -  Weight 272 lbs 13 oz 277 lbs -  BMI 97.53 00.51 -  Systolic 102 111 735  Diastolic 78 92 82  Pulse 670 111 86     Physical Exam Vitals reviewed.  Constitutional:      General: She is not in acute distress.    Appearance: Normal appearance.  HENT:     Head: Normocephalic and atraumatic.  Neck:     Vascular: No carotid bruit.  Cardiovascular:     Rate and Rhythm: Normal rate and regular rhythm.     Pulses: Normal pulses.     Heart sounds: Normal heart sounds.  Pulmonary:     Effort: Pulmonary effort is normal.     Breath sounds: Normal breath sounds.  Skin:    General: Skin is warm and dry.  Neurological:     General: No focal deficit present.     Mental Status: She is alert and oriented to person, place, and time.  Psychiatric:        Mood and Affect: Mood normal.        Behavior:  Behavior normal.        Judgment: Judgment normal.         Assessment and Plan   1. Hyperlipidemia, unspecified hyperlipidemia type   2. Prediabetes   3. Class 3 severe obesity with body mass index (BMI) of 50.0 to 59.9 in adult, unspecified obesity type, unspecified whether serious comorbidity present ([place])      Plan: 1.-3.  We will check blood work today for further evaluation.  She was encouraged to continue focusing on the lifestyle changes she is made thus far.   Tests ordered Orders Placed This Encounter  Procedures   Hemoglobin A1c   Lipid Panel   CMP with eGFR([facility])   CBC   TSH      No orders of the defined types were placed in this encounter.   Patient to follow-up in 6 months or sooner as needed.  Ailene Ards, NP

## 2021-06-22 LAB — COMPLETE METABOLIC PANEL WITH GFR
AG Ratio: 1.9 (calc) (ref 1.0–2.5)
ALT: 21 U/L (ref 6–29)
AST: 21 U/L (ref 10–35)
Albumin: 4.6 g/dL (ref 3.6–5.1)
Alkaline phosphatase (APISO): 76 U/L (ref 37–153)
BUN: 20 mg/dL (ref 7–25)
CO2: 20 mmol/L (ref 20–32)
Calcium: 9.9 mg/dL (ref 8.6–10.4)
Chloride: 105 mmol/L (ref 98–110)
Creat: 0.91 mg/dL (ref 0.50–1.05)
GFR, Est African American: 81 mL/min/{1.73_m2} (ref >=60)
GFR, Est Non African American: 70 mL/min/{1.73_m2} (ref >=60)
Globulin: 2.4 g/dL (calc) (ref 1.9–3.7)
Glucose, Bld: 87 mg/dL (ref 65–99)
Potassium: 4.5 mmol/L (ref 3.5–5.3)
Sodium: 141 mmol/L (ref 135–146)
Total Bilirubin: 0.3 mg/dL (ref 0.2–1.2)
Total Protein: 7 g/dL (ref 6.1–8.1)

## 2021-06-22 LAB — CBC
HCT: 40.3 % (ref 35.0–45.0)
Hemoglobin: 13.2 g/dL (ref 11.7–15.5)
MCH: 28.7 pg (ref 27.0–33.0)
MCHC: 32.8 g/dL (ref 32.0–36.0)
MCV: 87.6 fL (ref 80.0–100.0)
MPV: 11.9 fL (ref 7.5–12.5)
Platelets: 281 10*3/uL (ref 140–400)
RBC: 4.6 10*6/uL (ref 3.80–5.10)
RDW: 13.4 % (ref 11.0–15.0)
WBC: 6.4 10*3/uL (ref 3.8–10.8)

## 2021-06-22 LAB — HEMOGLOBIN A1C
Hgb A1c MFr Bld: 5.4 % of total Hgb (ref <5.7)
Mean Plasma Glucose: 108 mg/dL
eAG (mmol/L): 6 mmol/L

## 2021-06-22 LAB — LIPID PANEL
Cholesterol: 156 mg/dL (ref <200)
HDL: 51 mg/dL (ref >=50)
LDL Cholesterol (Calc): 83 mg/dL (calc)
Non-HDL Cholesterol (Calc): 105 mg/dL (calc) (ref <130)
Total CHOL/HDL Ratio: 3.1 (calc) (ref <5.0)
Triglycerides: 119 mg/dL (ref <150)

## 2021-06-22 LAB — TSH: TSH: 2.06 mIU/L (ref 0.40–4.50)

## 2021-07-31 ENCOUNTER — Other Ambulatory Visit: Payer: Self-pay | Admitting: Nurse Practitioner

## 2021-07-31 DIAGNOSIS — Z1231 Encounter for screening mammogram for malignant neoplasm of breast: Secondary | ICD-10-CM

## 2021-08-06 ENCOUNTER — Encounter (INDEPENDENT_AMBULATORY_CARE_PROVIDER_SITE_OTHER): Payer: Self-pay | Admitting: Nurse Practitioner

## 2021-08-08 ENCOUNTER — Other Ambulatory Visit: Payer: Self-pay

## 2021-08-08 ENCOUNTER — Ambulatory Visit
Admission: RE | Admit: 2021-08-08 | Discharge: 2021-08-08 | Disposition: A | Discharge status: Home / Self Care | Payer: 59 | Source: Ambulatory Visit | Attending: Nurse Practitioner | Admitting: Nurse Practitioner

## 2021-08-08 DIAGNOSIS — Z1231 Encounter for screening mammogram for malignant neoplasm of breast: Secondary | ICD-10-CM

## 2021-12-06 ENCOUNTER — Encounter (INDEPENDENT_AMBULATORY_CARE_PROVIDER_SITE_OTHER): Payer: 59 | Admitting: Nurse Practitioner

## 2021-12-11 ENCOUNTER — Encounter: Payer: Self-pay | Admitting: Family Medicine

## 2021-12-11 ENCOUNTER — Telehealth (INDEPENDENT_AMBULATORY_CARE_PROVIDER_SITE_OTHER): Payer: 59 | Admitting: Family Medicine

## 2021-12-11 ENCOUNTER — Other Ambulatory Visit: Payer: Self-pay

## 2021-12-11 VITALS — BP 109/74 | HR 74

## 2021-12-11 DIAGNOSIS — U071 COVID-19: Secondary | ICD-10-CM

## 2021-12-11 MED ORDER — NIRMATRELVIR/RITONAVIR (PAXLOVID)TABLET: 3.0000 | ORAL_TABLET | Freq: Two times a day (BID) | ORAL | 0 refills | Status: AC

## 2021-12-11 NOTE — Progress Notes (Signed)
Virtual Visit via Telephone Note  I connected with Leslie Fischer on 12/11/21 at  4:00 PM EST by telephone and verified that I am speaking with the correct person using two identifiers.   I discussed the limitations, risks, security and privacy concerns of performing an evaluation and management service by telephone and the availability of in person appointments. I also discussed with the patient that there may be a patient responsible charge related to this service. The patient expressed understanding and agreed to proceed.  Location patient: home, [place] Location provider: work or home office Participants present for the call: patient, provider Patient did not have a visit with me in the prior 7 days to address this/these issue(s).   History of Present Illness:  Acute telemedicine visit for Covid19: -Onset: about 2 days ago; tested positive for covid yesterday -mother has covid and she has been with her -Symptoms include: nasal congestion, sore throat, cough, headache -Denies: fevers, NVD, CP, SOB -Pertinent past medical history:see below, has had covid before, has obesity -Pertinent medication allergies: No Known Allergies -COVID-19 vaccine status:vaccinated x2 and had 2 boosters -GFR 70   Past Medical History:  Diagnosis Date   Arthritis    Headache(784.0)    IBS (irritable bowel syndrome)    No pertinent past medical history    Rosacea    Face     Current Outpatient Medications on File Prior to Visit  Medication Sig Dispense Refill   acetaminophen (TYLENOL) 500 MG tablet Take 500 mg by mouth every 6 (six) hours as needed for mild pain. As needed.     Ascorbic Acid (VITAMIN C) 1000 MG tablet Take 1,000 mg by mouth daily.     Calcium Carb-Cholecalciferol (CALTRATE 600+D3) 600-800 MG-UNIT TABS Take by mouth.     Cholecalciferol 125 MCG (5000 UT) TABS Take 1 tablet by mouth. Take 1 tablet by mouth three times a week     diphenhydrAMINE (BENADRYL) 25 MG tablet Take 25 mg by  mouth every 6 (six) hours as needed for allergies.     doxycycline (ADOXA) 100 MG tablet Take 100 mg by mouth daily.     loratadine (CLARITIN) 10 MG tablet Take 10 mg by mouth daily.     Multiple Vitamin (MULTIVITAMIN WITH MINERALS) TABS tablet Take 1 tablet by mouth daily.     naproxen sodium (ALEVE) 220 MG tablet Take 220 mg by mouth.     Probiotic Product (TRUBIOTICS PO) Take by mouth.     zinc gluconate 50 MG tablet Take 50 mg by mouth daily.     No current facility-administered medications on file prior to visit.    Observations/Objective: Patient sounds cheerful and well on the phone. I do not appreciate any SOB. Speech and thought processing are grossly intact. Patient reported vitals:  Assessment and Plan:  COVID-19   Discussed treatment options and risk of drug interactions, ideal treatment window, potential complications, isolation and precautions for COVID-19.  Discussed possibility of rebound with antivirals and the need to reisolate if it should occur for 5 days. Checked for/reviewed most recent GFR.After lengthy discussion, the patient opted for treatment with Paxlovid due to being higher risk for complications of covid or severe disease and other factors. Discussed EUA status of this drug and the fact that there is preliminary limited knowledge of risks/interactions/side effects per EUA document vs possible benefits and precautions. This information was shared with patient during the visit and also was provided in patient instructions.  Other symptomatic care measures summarized  in patient instructions. Work/School slipped offered:declined Advised to seek prompt in person care if worsening, new symptoms arise, or if is not improving with treatment. Advised of options for inperson care in case PCP office not available. Did let the patient know that I only do telemedicine shifts for [place] on Tuesdays and Thursdays and advised a follow up visit with PCP or at an [facility] if has  further questions or concerns.   Follow Up Instructions:  I did not refer this patient for an OV with me in the next 24 hours for this/these issue(s).  I discussed the assessment and treatment plan with the patient. The patient was provided an opportunity to ask questions and all were answered. The patient agreed with the plan and demonstrated an understanding of the instructions.   I spent 13 minutes on the date of this visit in the care of this patient. See summary of tasks completed to properly care for this patient in the detailed notes above which also included counseling of above, review of PMH, medications, allergies, evaluation of the patient and ordering and/or  instructing patient on testing and care options.     Terressa Koyanagi, DO

## 2021-12-11 NOTE — Patient Instructions (Addendum)
HOME CARE TIPS:  -COVID19 testing information: [email or web address]  Most pharmacies also offer testing and home test kits. If the Covid19 test is positive and you desire antiviral treatment, please contact a [place] or schedule a follow up virtual visit through your primary care office or through the [facility].  Other test to treat options: [email or web address]  -I sent the medication(s) we discussed to your pharmacy: Meds ordered this encounter  Medications   nirmatrelvir/ritonavir EUA (PAXLOVID) 20 x 150 MG & 10 x $Re'100MG'Shz$  TABS    Sig: Take 3 tablets by mouth 2 (two) times daily for 5 days. (Take nirmatrelvir 150 mg two tablets twice daily for 5 days and ritonavir 100 mg one tablet twice daily for 5 days) Patient GFR is > 60 this summer    Dispense:  30 tablet    Refill:  0     -I sent in the Covid19 treatment or referral you requested per our discussion. Please see the information provided below and discuss further with the pharmacist/treatment team.  -If taking Paxlovid, please review all medications, supplement and over the counter drugs with your pharmacist and ask them to check for any interactions. Please make the following changes to your regular medications while taking Paxlovid: *hold your supplements during treatment  -there is a chance of rebound illness after finishing your treatment. If you become sick again please isolate for an additional 5 days, plus 5 more days of masking.   -can use tylenol or aleve if needed for fevers, aches and pains per instructions  -can use nasal saline a few times per day if you have nasal congestion  -stay hydrated, drink plenty of fluids and eat small healthy meals - avoid dairy  -If the Covid test is positive, check out the [facility] website for more information on home care, transmission and treatment for [place]  -stay home while sick, except to seek medical care. If  you have COVID19, you will likely be contagious for 7-10 days. Flu or Influenza is likely contagious for about 7 days. Other respiratory viral infections remain contagious for 5-10+ days depending on the virus and many other factors. Wear a good mask that fits snugly (such as N95 or KN95) if around others to reduce the risk of transmission.  It was nice to meet you today, and I really hope you are feeling better soon. I help [facility] out with telemedicine visits on Tuesdays and Thursdays and am happy to help if you need a follow up virtual visit on those days. Otherwise, if you have any concerns or questions following this visit please schedule a follow up visit with your Primary Care doctor or seek care at a local urgent care clinic to avoid delays in care.    Seek in person care or schedule a follow up video visit promptly if your symptoms worsen, new concerns arise or you are not improving with treatment. Call 911 and/or seek emergency care if your symptoms are severe or life threatening.  FACT SHEET FOR PATIENTS, PARENTS, AND CAREGIVERS EMERGENCY USE AUTHORIZATION (EUA) OF PAXLOVID FOR CORONAVIRUS DISEASE 2019 (COVID-19) You are being given this Fact Sheet because your healthcare provider believes it is necessary to provide you with PAXLOVID for the treatment of mild-to-moderate coronavirus disease (COVID-19) caused by the SARS-CoV-2 virus. This Fact Sheet contains information to help you understand the risks and benefits of taking the PAXLOVID you have received or may receive. The U.S. Food and Drug Administration (FDA) has issued an Emergency Use Authorization (EUA)  to make PAXLOVID available during the COVID-19 pandemic (for more details about an EUA please see What is an Emergency Use Authorization? at the end of this document). PAXLOVID is not an FDA-approved medicine in the [place]. Read this Fact Sheet for information about PAXLOVID. Talk to your healthcare provider about  your options or if you have any questions. It is your choice to take PAXLOVID.  What is COVID-19? COVID-19 is caused by a virus called a coronavirus. You can get COVID-19 through close contact with another person who has the virus. COVID-19 illnesses have ranged from very mild-to-severe, including illness resulting in death. While information so far suggests that most COVID-19 illness is mild, serious illness can happen and may cause some of your other medical conditions to become worse. Older people and people of all ages with severe, long lasting (chronic) medical conditions like heart disease, lung disease, and diabetes, for example seem to be at higher risk of being hospitalized for COVID-19.  What is PAXLOVID? PAXLOVID is an investigational medicine used to treat mild-to-moderate COVID-19 in adults and children [64 years of age and older weighing at least 65 pounds (9 kg)] with positive results of direct SARS-CoV-2 viral testing, and who are at high risk for progression to severe COVID-19, including hospitalization or death. PAXLOVID is investigational because it is still being studied. There is limited information about the safety and effectiveness of using PAXLOVID to treat people with mild-to-moderate COVID-19.  The FDA has authorized the emergency use of PAXLOVID for the treatment of mild-tomoderate COVID-19 in adults and children [91 years of age and older weighing at least 74 pounds (66 kg)] with a positive test for the virus that causes COVID-19, and who are at high risk for progression to severe COVID-19, including hospitalization or death, under an EUA. 1 Revised: 02 March 2021   What should I tell my healthcare provider before I take PAXLOVID? Tell your healthcare provider if you: ? Have any allergies ? Have liver or kidney disease ? Are pregnant or plan to become pregnant ? Are breastfeeding a child ? Have any serious illnesses  Tell your healthcare provider about  all the medicines you take, including prescription and over-the-counter medicines, vitamins, and herbal supplements. Some medicines may interact with PAXLOVID and may cause serious side effects. Keep a list of your medicines to show your healthcare provider and pharmacist when you get a new medicine.  You can ask your healthcare provider or pharmacist for a list of medicines that interact with PAXLOVID. Do not start taking a new medicine without telling your healthcare provider. Your healthcare provider can tell you if it is safe to take PAXLOVID with other medicines.  Tell your healthcare provider if you are taking combined hormonal contraceptive. PAXLOVID may affect how your birth control pills work. Females who are able to become pregnant should use another effective alternative form of contraception or an additional barrier method of contraception. Talk to your healthcare provider if you have any questions about contraceptive methods that might be right for you.  How do I take PAXLOVID? ? PAXLOVID consists of 2 medicines: nirmatrelvir and ritonavir. o Take 2 pink tablets of nirmatrelvir with 1 white tablet of ritonavir by mouth 2 times each day (in the morning and in the evening) for 5 days. For each dose, take all 3 tablets at the same time. o If you have kidney disease, talk to your healthcare provider. You may need a different dose. ? Swallow the tablets whole. Do  not chew, break, or crush the tablets. ? Take PAXLOVID with or without food. ? Do not stop taking PAXLOVID without talking to your healthcare provider, even if you feel better. ? If you miss a dose of PAXLOVID within 8 hours of the time it is usually taken, take it as soon as you remember. If you miss a dose by more than 8 hours, skip the missed dose and take the next dose at your regular time. Do not take 2 doses of PAXLOVID at the same time. ? If you take too much PAXLOVID, call your healthcare provider or go to  the nearest hospital emergency room right away. ? If you are taking a ritonavir- or cobicistat-containing medicine to treat hepatitis C or Human Immunodeficiency Virus (HIV), you should continue to take your medicine as prescribed by your healthcare provider. 2 Revised: 02 March 2021    Talk to your healthcare provider if you do not feel better or if you feel worse after 5 days.  Who should generally not take PAXLOVID? Do not take PAXLOVID if: ? You are allergic to nirmatrelvir, ritonavir, or any of the ingredients in PAXLOVID. ? You are taking any of the following medicines: o Alfuzosin o Pethidine, propoxyphene o Ranolazine o Amiodarone, dronedarone, flecainide, propafenone, quinidine o Colchicine o Lurasidone, pimozide, clozapine o Dihydroergotamine, ergotamine, methylergonovine o Lovastatin, simvastatin o Sildenafil (Revatio) for pulmonary arterial hypertension (PAH) o Triazolam, oral midazolam o Apalutamide o Carbamazepine, phenobarbital, phenytoin o Rifampin o St. Johns Wort (hypericum perforatum) Taking PAXLOVID with these medicines may cause serious or life-threatening side effects or affect how PAXLOVID works.  These are not the only medicines that may cause serious side effects if taken with PAXLOVID. PAXLOVID may increase or decrease the levels of multiple other medicines. It is very important to tell your healthcare provider about all of the medicines you are taking because additional laboratory tests or changes in the dose of your other medicines may be necessary while you are taking PAXLOVID. Your healthcare provider may also tell you about specific symptoms to watch out for that may indicate that you need to stop or decrease the dose of some of your other medicines.  What are the important possible side effects of PAXLOVID? Possible side effects of PAXLOVID are: ? Allergic Reactions. Allergic reactions can happen in people taking PAXLOVID, even after only 1  dose. Stop taking PAXLOVID and call your healthcare provider right away if you get any of the following symptoms of an allergic reaction: o hives o trouble swallowing or breathing o swelling of the mouth, lips, or face o throat tightness o hoarseness 3 Revised: 02 March 2021  o skin rash ? Liver Problems. Tell your healthcare provider right away if you have any of these signs and symptoms of liver problems: loss of appetite, yellowing of your skin and the whites of eyes (jaundice), dark-colored urine, pale colored stools and itchy skin, stomach area (abdominal) pain. ? Resistance to HIV Medicines. If you have untreated HIV infection, PAXLOVID may lead to some HIV medicines not working as well in the future. ? Other possible side effects include: o altered sense of taste o diarrhea o high blood pressure o muscle aches These are not all the possible side effects of PAXLOVID. Not many people have taken PAXLOVID. Serious and unexpected side effects may happen. PAXLOVID is still being studied, so it is possible that all of the risks are not known at this time.  What other treatment choices are there? Veklury (remdesivir)  is FDA-approved for the treatment of mild-to-moderate [id number] in certain adults and children. Talk with your doctor to see if Marijean Heath is appropriate for you. Like PAXLOVID, FDA may also allow for the emergency use of other medicines to treat people with COVID-19. Go to https://price.info/ for information on the emergency use of other medicines that are authorized by FDA to treat people with COVID-19. Your healthcare provider may talk with you about clinical trials for which you may be eligible. It is your choice to be treated or not to be treated with PAXLOVID. Should you decide not to receive it or for your child not to receive it, it will not change your  standard medical care.  What if I am pregnant or breastfeeding? There is no experience treating pregnant women or breastfeeding mothers with PAXLOVID. For a mother and unborn baby, the benefit of taking PAXLOVID may be greater than the risk from the treatment. If you are pregnant, discuss your options and specific situation with your healthcare provider. It is recommended that you use effective barrier contraception or do not have sexual activity while taking PAXLOVID. If you are breastfeeding, discuss your options and specific situation with your healthcare provider. 4 Revised: 02 March 2021   How do I report side effects with PAXLOVID? Contact your healthcare provider if you have any side effects that bother you or do not go away. Report side effects to FDA MedWatch at SmoothHits.hu or call 1-800-FDA1088 or you can report side effects to [employer]. at the contact information provided below. Website Fax number Telephone number www.pfizersafetyreporting.com [phone] How should I store [place]? Store PAXLOVID tablets at room temperature, between 68?F to 77?F (20?C to 25?C). How can I learn more about COVID-19? ? Ask your healthcare provider. ? Visit https://jacobson-johnson.com/. ? Contact your local or state public health department. What is an Emergency Use Authorization (EUA)? The [place] FDA has made PAXLOVID available under an emergency access mechanism called an Emergency Use Authorization (EUA). The EUA is supported by a Education officer, museum and Human Service (HHS) declaration that circumstances exist to justify the emergency use of drugs and biological products during the COVID-19 pandemic. PAXLOVID for the treatment of mild-to-moderate COVID-19 in adults and children [5 years of age and older weighing at least 67 pounds (84 kg)] with positive results of direct SARS-CoV-2 viral testing, and who are at high risk for progression to  severe COVID-19, including hospitalization or death, has not undergone the same type of review as an FDA-approved product. In issuing an EUA under the GYKZL-93 public health emergency, the FDA has determined, among other things, that based on the total amount of scientific evidence available including data from adequate and well-controlled clinical trials, if available, it is reasonable to believe that the product may be effective for diagnosing, treating, or preventing COVID-19, or a serious or life-threatening disease or condition caused by COVID-19; that the known and potential benefits of the product, when used to diagnose, treat, or prevent such disease or condition, outweigh the known and potential risks of such product; and that there are no adequate, approved, and available alternatives. All of these criteria must be met to allow for the product to be used in the treatment of patients during the COVID-19 pandemic. The EUA for PAXLOVID is in effect for the duration of the COVID-19 declaration justifying emergency use of this product, unless terminated or revoked (after which the products may no longer be used under the EUA). 5 Revised: 02 March 2021  2022     Additional Information For general questions, visit the website or call the telephone number provided below. Website Telephone number www.COVID19oralRx.com [phone] (1-877-C19-PACK) You can also go to www.pfizermedinfo.com or call [phone] for more information. [id number] Revised: 02 March 2021

## 2021-12-14 ENCOUNTER — Telehealth: Payer: Self-pay | Admitting: Nurse Practitioner

## 2021-12-14 ENCOUNTER — Encounter: Payer: 59 | Admitting: Internal Medicine

## 2021-12-14 NOTE — Telephone Encounter (Signed)
Patient called because she had an allergic reaction to the paxlovid and wanted to speak to Dr.Kim or CMA. I let her know that she was a virtual doctor and was out of the office so she may need to follow up with her primary. Patient wanted a callback from [place], I stated that she was with a different provider today so she was currently busy, but I would send message back.     Please advise

## 2021-12-14 NOTE — Telephone Encounter (Signed)
Also wanted it noted, patient was sent to triage and she did state that she took benadryl, which did seem to rectify situation

## 2021-12-27 ENCOUNTER — Ambulatory Visit (INDEPENDENT_AMBULATORY_CARE_PROVIDER_SITE_OTHER): Payer: 59 | Admitting: Nurse Practitioner

## 2022-02-21 ENCOUNTER — Encounter: Payer: 59 | Admitting: Nurse Practitioner

## 2022-04-19 ENCOUNTER — Encounter: Payer: Self-pay | Admitting: Nurse Practitioner

## 2022-04-19 ENCOUNTER — Ambulatory Visit (INDEPENDENT_AMBULATORY_CARE_PROVIDER_SITE_OTHER): Payer: 59 | Admitting: Nurse Practitioner

## 2022-04-19 VITALS — BP 140/90 | HR 74 | Temp 98.1°F | Ht 61.0 in | Wt 227.0 lb

## 2022-04-19 DIAGNOSIS — R300 Dysuria: Secondary | ICD-10-CM

## 2022-04-19 DIAGNOSIS — R7303 Prediabetes: Secondary | ICD-10-CM | POA: Diagnosis not present

## 2022-04-19 DIAGNOSIS — R5383 Other fatigue: Secondary | ICD-10-CM | POA: Diagnosis not present

## 2022-04-19 DIAGNOSIS — Z0001 Encounter for general adult medical examination with abnormal findings: Secondary | ICD-10-CM | POA: Insufficient documentation

## 2022-04-19 DIAGNOSIS — E119 Type 2 diabetes mellitus without complications: Secondary | ICD-10-CM | POA: Insufficient documentation

## 2022-04-19 DIAGNOSIS — R2242 Localized swelling, mass and lump, left lower limb: Secondary | ICD-10-CM

## 2022-04-19 DIAGNOSIS — E785 Hyperlipidemia, unspecified: Secondary | ICD-10-CM | POA: Diagnosis not present

## 2022-04-19 LAB — POC URINALSYSI DIPSTICK (AUTOMATED)
Bilirubin, UA: NEGATIVE
Blood, UA: NEGATIVE
Glucose, UA: NEGATIVE
Ketones, UA: NEGATIVE
Leukocytes, UA: NEGATIVE
Nitrite, UA: NEGATIVE
Protein, UA: NEGATIVE
Spec Grav, UA: 1.015 (ref 1.010–1.025)
Urobilinogen, UA: 0.2 E.U./dL
pH, UA: 6 (ref 5.0–8.0)

## 2022-04-19 LAB — COMPREHENSIVE METABOLIC PANEL
ALT: 15 U/L (ref 0–35)
AST: 18 U/L (ref 0–37)
Albumin: 4.6 g/dL (ref 3.5–5.2)
Alkaline Phosphatase: 56 U/L (ref 39–117)
BUN: 20 mg/dL (ref 6–23)
CO2: 27 mEq/L (ref 19–32)
Calcium: 9.7 mg/dL (ref 8.4–10.5)
Chloride: 104 mEq/L (ref 96–112)
Creatinine, Ser: 0.87 mg/dL (ref 0.40–1.20)
GFR: 73.49 mL/min (ref >60.00)
Glucose, Bld: 91 mg/dL (ref 70–99)
Potassium: 4.4 mEq/L (ref 3.5–5.1)
Sodium: 140 mEq/L (ref 135–145)
Total Bilirubin: 0.5 mg/dL (ref 0.2–1.2)
Total Protein: 7.2 g/dL (ref 6.0–8.3)

## 2022-04-19 LAB — CBC WITH DIFFERENTIAL/PLATELET
Basophils Absolute: 0.1 10*3/uL (ref 0.0–0.1)
Basophils Relative: 0.8 % (ref 0.0–3.0)
Eosinophils Absolute: 0.3 10*3/uL (ref 0.0–0.7)
Eosinophils Relative: 3.4 % (ref 0.0–5.0)
HCT: 40.5 % (ref 36.0–46.0)
Hemoglobin: 13.1 g/dL (ref 12.0–15.0)
Lymphocytes Relative: 39.7 % (ref 12.0–46.0)
Lymphs Abs: 3.1 10*3/uL (ref 0.7–4.0)
MCHC: 32.5 g/dL (ref 30.0–36.0)
MCV: 90.2 fl (ref 78.0–100.0)
Monocytes Absolute: 0.6 10*3/uL (ref 0.1–1.0)
Monocytes Relative: 8.3 % (ref 3.0–12.0)
Neutro Abs: 3.7 10*3/uL (ref 1.4–7.7)
Neutrophils Relative %: 47.8 % (ref 43.0–77.0)
Platelets: 274 10*3/uL (ref 150.0–400.0)
RBC: 4.48 Mil/uL (ref 3.87–5.11)
RDW: 13.3 % (ref 11.5–15.5)
WBC: 7.7 10*3/uL (ref 4.0–10.5)

## 2022-04-19 LAB — LIPID PANEL
Cholesterol: 190 mg/dL (ref 0–200)
HDL: 67.5 mg/dL (ref >39.00)
LDL Cholesterol: 102 mg/dL — ABNORMAL HIGH (ref 0–99)
NonHDL: 122.87
Total CHOL/HDL Ratio: 3
Triglycerides: 104 mg/dL (ref 0.0–149.0)
VLDL: 20.8 mg/dL (ref 0.0–40.0)

## 2022-04-19 LAB — URINALYSIS WITH CULTURE, IF INDICATED
Bilirubin Urine: NEGATIVE
Hgb urine dipstick: NEGATIVE
Ketones, ur: NEGATIVE
Leukocytes,Ua: NEGATIVE
Nitrite: NEGATIVE
RBC / HPF: NONE SEEN (ref 0–?)
Specific Gravity, Urine: 1.015 (ref 1.000–1.030)
Total Protein, Urine: NEGATIVE
Urine Glucose: NEGATIVE
Urobilinogen, UA: 0.2 (ref 0.0–1.0)
pH: 6.5 (ref 5.0–8.0)

## 2022-04-19 LAB — MICROALBUMIN / CREATININE URINE RATIO
Creatinine,U: 114.5 mg/dL
Microalb Creat Ratio: 0.6 mg/g (ref 0.0–30.0)
Microalb, Ur: <0.7 mg/dL (ref 0.0–1.9)

## 2022-04-19 LAB — TSH: TSH: 0.96 u[IU]/mL (ref 0.35–5.50)

## 2022-04-19 LAB — HEMOGLOBIN A1C: Hgb A1c MFr Bld: 5.6 % (ref 4.6–6.5)

## 2022-04-19 MED ORDER — NITROFURANTOIN MONOHYD MACRO 100 MG PO CAPS: 100.0000 mg | ORAL_CAPSULE | Freq: Two times a day (BID) | ORAL | 0 refills | Status: DC

## 2022-04-19 NOTE — Assessment & Plan Note (Signed)
Blood work ordered today, further recommendations may be made based upon these results. 

## 2022-04-19 NOTE — Assessment & Plan Note (Signed)
Clinically this appears to be either cyst or possible lipoma.  However, ultrasound ordered for further evaluation. Further recommendations may be made based upon these results. ?

## 2022-04-19 NOTE — Addendum Note (Signed)
Addended by: Delsa Grana R on: 04/19/2022 03:46 PM ? ? Modules accepted: Orders ? ?

## 2022-04-19 NOTE — Assessment & Plan Note (Signed)
Fasting lipid panel ordered today, further recommendations may be made based upon these results. ?

## 2022-04-19 NOTE — Progress Notes (Signed)
? ? ? ?Subjective:  ?Patient ID: Leslie Fischer, female    DOB: [birth year]  Age: 56 y.o. MRN: [id number] ? ?CC:  ?Chief Complaint  ?Patient presents with  ? Annual Exam  ?  ? ? ?HPI  ?This patient arrives today for the above. ? ?She is due for colon cancer screening and shingles vaccine.  Screening mammogram will be due later this summer, and Pap smear will be due in about 2 months.  She reports she recently changed insurances so she will need new referral to OB/GYN in the near future as well as will need to go to a different imaging center (previously went to the breast center) for her mammogram. ? ?She has been participating in intermittent fasting as well as a low-carb diet since last time I saw her.  She has been able to lose approximately 70 pounds.  She had an A1c of 6.0 in December 2021, she got this down to 5.4 with lifestyle measures alone in July 2022.  She also has a history of mild hyperlipidemia (total 201, LDL 122, and triglycerides 182 back in December 2021; with lifestyle measures improved to total 156, LDL 83, triglyceride 119 in July 2022).  ? ?Today she reports overall feeling well.  She does get fatigued intermittently but has also been experiencing increased caregiver burden when she was helping care for her mom over the last 7 weeks he just went through the replacement surgery.  Additionally, she has a sibling who is undergoing evaluation for palpable colon cancer.  Over the last week she has had mild dysuria as well as urinary frequency.  She tells me she has increased her intake of cranberry juice and since then symptoms have improved but she would like to have urine checked for UTI.  She also reports a mass to her left lower extremity.  She noticed this about 2 months ago, size has been stable and it is nontender.  She like this to be evaluated. ? ?Past Medical History:  ?Diagnosis Date  ? Arthritis   ? Headache(784.0)   ? IBS (irritable bowel syndrome)   ? No pertinent past medical  history   ? Rosacea   ? Face  ? ? ? ? ?Family History  ?Problem Relation Age of Onset  ? Hypertension Mother   ? Cancer Father   ? Aneurysm Maternal Grandfather   ? Hypertension Brother   ? Hypertension Sister   ? Thyroid nodules Sister   ? Aneurysm Maternal Aunt   ? Hypertension Brother   ? Cancer Brother   ? Other Brother   ?     desert storm issues  ? Hypertension Brother   ? Other Sister   ?     suicide  ? Breast cancer Sister   ? ? ?Social History  ? ?Social History Narrative  ? Widow.  ? ?Social History  ? ?Tobacco Use  ? Smoking status: Never  ? Smokeless tobacco: Never  ?Substance Use Topics  ? Alcohol use: No  ? ? ? ?Current Meds  ?Medication Sig  ? nitrofurantoin, macrocrystal-monohydrate, (MACROBID) 100 MG capsule Take 1 capsule (100 mg total) by mouth 2 (two) times daily.  ? ? ?ROS:  ?Review of Systems  ?Constitutional:  Positive for malaise/fatigue. Negative for weight loss.  ?Respiratory:  Negative for shortness of breath.   ?Cardiovascular:  Negative for chest pain.  ?Genitourinary:  Positive for dysuria and frequency. Negative for flank pain and urgency.  ?Musculoskeletal:  Positive for back pain.  ?  Neurological:  Negative for seizures and loss of consciousness.  ? ? ?Objective:  ? ?Today's Vitals: BP 140/90 (BP Location: Left Arm, Patient Position: Sitting, Cuff Size: Normal)   Pulse 74   Temp 98.1 ?F (36.7 ?C) (Oral)   Ht 5\' 1"  (1.549 m)   Wt 227 lb (103 kg)   LMP 01/24/2019 (Approximate) Comment: had bleeding march 2021  SpO2 99%   BMI 42.89 kg/m?  ? ?  04/19/2022  ?  1:07 PM 12/11/2021  ?  3:38 PM 06/21/2021  ?  7:54 AM  ?Vitals with BMI  ?Height 5\' 1"   5\' 1"   ?Weight 227 lbs  272 lbs 13 oz  ?BMI 42.91  51.57  ?Systolic 140 109 161122  ?Diastolic 90 74 78  ?Pulse 74 74 105  ?  ? ?Physical Exam ?Vitals reviewed. Exam conducted with a chaperone present.  ?Constitutional:   ?   Appearance: Normal appearance.  ?HENT:  ?   Head: Normocephalic and atraumatic.  ?   Right Ear: Tympanic membrane, ear  canal and external ear normal.  ?   Left Ear: Tympanic membrane, ear canal and external ear normal.  ?Eyes:  ?   General:     ?   Right eye: No discharge.     ?   Left eye: No discharge.  ?   Extraocular Movements: Extraocular movements intact.  ?   Conjunctiva/sclera: Conjunctivae normal.  ?   Pupils: Pupils are equal, round, and reactive to light.  ?Neck:  ?   Vascular: No carotid bruit.  ?Cardiovascular:  ?   Rate and Rhythm: Normal rate and regular rhythm.  ?   Pulses: Normal pulses.  ?   Heart sounds: Normal heart sounds. No murmur heard. ?Pulmonary:  ?   Effort: Pulmonary effort is normal.  ?   Breath sounds: Normal breath sounds.  ?Chest:  ?Breasts: ?   Breasts are symmetrical.  ?   Right: Normal.  ?   Left: Normal.  ?   Comments: Leonie GreenMaria Hernandez as chaperone ?Abdominal:  ?   General: Abdomen is flat. Bowel sounds are normal. There is no distension.  ?   Palpations: Abdomen is soft. There is no mass.  ?   Tenderness: There is no abdominal tenderness.  ?Musculoskeletal:     ?   General: No tenderness.  ?   Cervical back: Neck supple. No muscular tenderness.  ?   Right lower leg: No edema.  ?   Left lower leg: No edema.  ?Lymphadenopathy:  ?   Cervical: No cervical adenopathy.  ?   Upper Body:  ?   Right upper body: No supraclavicular adenopathy.  ?   Left upper body: No supraclavicular adenopathy.  ?Skin: ?   General: Skin is warm and dry.  ? ?    ?Neurological:  ?   General: No focal deficit present.  ?   Mental Status: She is alert and oriented to person, place, and time.  ?   Motor: No weakness.  ?   Gait: Gait normal.  ?Psychiatric:     ?   Mood and Affect: Mood normal.     ?   Behavior: Behavior normal.     ?   Judgment: Judgment normal.  ? ? ? ? ? ? ? ?Assessment and Plan  ? ?1. Encounter for general adult medical examination with abnormal findings   ?2. Fatigue, unspecified type   ?3. Prediabetes   ?4. Hyperlipidemia, unspecified hyperlipidemia type   ?5. Dysuria   ?  6. Mass of left lower leg    ? ? ? ?Plan: ?See plan via problem list below. ?3.  A1c ordered today, further recommendations be made based upon these results. ? ?Tests ordered ?Orders Placed This Encounter  ?Procedures  ? [place] LT LOWER EXTREM LTD SOFT TISSUE NON VASCULAR  ? TSH  ? Hemoglobin A1c  ? Lipid panel  ? Comprehensive metabolic panel  ? CBC with Differential/Platelet  ? Urinalysis with Culture, if indicated  ? Microalbumin / creatinine urine ratio  ? ? ? ? ?Meds ordered this encounter  ?Medications  ? nitrofurantoin, macrocrystal-monohydrate, (MACROBID) 100 MG capsule  ?  Sig: Take 1 capsule (100 mg total) by mouth 2 (two) times daily.  ?  Dispense:  10 capsule  ?  Refill:  0  ?  Order Specific Question:   Supervising Provider  ?  Answer:   Pincus Sanes [[id number]]  ? ? ?Patient to follow-up in 6 months or sooner as needed. ? ?Elenore Paddy, NP ? ?

## 2022-04-19 NOTE — Assessment & Plan Note (Signed)
Point-of-care urinalysis negative, however patient remains symptomatic.  We will send urine off for culture to verify.  Prescription for Macrobid sent to patient's pharmacy in case symptoms worsen over the weekend, however patient told to avoid taking medication until urine culture results.  She is agreeable to this plan. ?

## 2022-04-19 NOTE — Assessment & Plan Note (Signed)
Overall exam within normal limits.  She does remain obese, but is working really hard at losing weight and so far has been very successful.  She was encouraged to continue living the lifestyle changes she is already made.  She plans on doing so.  We did discuss colon cancer screening and I highly encouraged her to consider colonoscopy now, she would like to hold off for right now.  She will let me know when she is ready to undergo this.  She was also encouraged to find out which imaging centers are covered under her insurance so we can do her mammogram which is due in about 3 months.  She will also look at OB/GYN providers and let me know which when she would like to be referred to to have cervical cancer screening completed.  She has decided to hold off on immunizations today. ?

## 2022-04-23 ENCOUNTER — Encounter: Payer: Self-pay | Admitting: Nurse Practitioner

## 2022-04-25 NOTE — Addendum Note (Signed)
Addended by: Jeralyn Ruths E on: 04/25/2022 12:55 PM ? ? Modules accepted: Orders ? ?

## 2022-04-25 NOTE — Addendum Note (Signed)
Addended by: Jiles Prows E on: 04/25/2022 01:08 PM ? ? Modules accepted: Orders ? ?

## 2022-04-26 ENCOUNTER — Ambulatory Visit (HOSPITAL_COMMUNITY)
Admission: RE | Admit: 2022-04-26 | Discharge: 2022-04-26 | Disposition: A | Discharge status: Home / Self Care | Payer: 59 | Source: Ambulatory Visit | Attending: Nurse Practitioner | Admitting: Nurse Practitioner

## 2022-04-26 ENCOUNTER — Encounter: Payer: Self-pay | Admitting: Nurse Practitioner

## 2022-04-26 DIAGNOSIS — R2242 Localized swelling, mass and lump, left lower limb: Secondary | ICD-10-CM | POA: Diagnosis not present

## 2022-04-29 ENCOUNTER — Other Ambulatory Visit (HOSPITAL_COMMUNITY): Payer: 59

## 2022-09-23 ENCOUNTER — Telehealth: Payer: 59 | Admitting: Physician Assistant

## 2022-09-23 DIAGNOSIS — J019 Acute sinusitis, unspecified: Secondary | ICD-10-CM | POA: Diagnosis not present

## 2022-09-23 DIAGNOSIS — B9789 Other viral agents as the cause of diseases classified elsewhere: Secondary | ICD-10-CM | POA: Diagnosis not present

## 2022-09-23 MED ORDER — FLUTICASONE PROPIONATE 50 MCG/ACT NA SUSP: 2.0000 | Freq: Every day | NASAL | 0 refills | Status: DC

## 2022-09-23 MED ORDER — BENZONATATE 100 MG PO CAPS: 100.0000 mg | ORAL_CAPSULE | Freq: Three times a day (TID) | ORAL | 0 refills | Status: DC | PRN

## 2022-09-23 NOTE — Progress Notes (Signed)
I have spent 5 minutes in review of e-visit questionnaire, review and updating patient chart, medical decision making and response to patient.   Brayden Brodhead Cody Remy Voiles, PA-C    

## 2022-09-23 NOTE — Progress Notes (Signed)
E-Visit for Sinus Problems  We are sorry that you are not feeling well.  Here is how we plan to help!  Based on what you have shared with me it looks like you have sinusitis.  Sinusitis is inflammation and infection in the sinus cavities of the head.  Based on your presentation I believe you most likely have Acute Viral Sinusitis.This is an infection most likely caused by a virus. There is not specific treatment for viral sinusitis other than to help you with the symptoms until the infection runs its course.  You may use an oral decongestant such as Mucinex D or if you have glaucoma or high blood pressure use plain Mucinex. Saline nasal spray help and can safely be used as often as needed for congestion, I have prescribed: Fluticasone nasal spray two sprays in each nostril once a day. I have also sent in a prescription cough medication to take as directed.   Some authorities believe that zinc sprays or the use of Echinacea may shorten the course of your symptoms.  Sinus infections are not as easily transmitted as other respiratory infection, however we still recommend that you avoid close contact with loved ones, especially the very young and elderly.  Remember to wash your hands thoroughly throughout the day as this is the number one way to prevent the spread of infection!  Home Care: Only take medications as instructed by your medical team. Do not take these medications with alcohol. A steam or ultrasonic humidifier can help congestion.  You can place a towel over your head and breathe in the steam from hot water coming from a faucet. Avoid close contacts especially the very young and the elderly. Cover your mouth when you cough or sneeze. Always remember to wash your hands.  Get Help Right Away If: You develop worsening fever or sinus pain. You develop a severe head ache or visual changes. Your symptoms persist after you have completed your treatment plan.  Make sure you Understand these  instructions. Will watch your condition. Will get help right away if you are not doing well or get worse.   Thank you for choosing an e-visit.  Your e-visit answers were reviewed by a board certified advanced clinical practitioner to complete your personal care plan. Depending upon the condition, your plan could have included both over the counter or prescription medications.  Please review your pharmacy choice. Make sure the pharmacy is open so you can pick up prescription now. If there is a problem, you may contact your provider through [employer] and have the prescription routed to another pharmacy.  Your safety is important to [place]. If you have drug allergies check your prescription carefully.   For the next 24 hours you can use MyChart to ask questions about today's visit, request a non-urgent call back, or ask for a work or school excuse. You will get an email in the next two days asking about your experience. I hope that your e-visit has been valuable and will speed your recovery.

## 2022-10-20 ENCOUNTER — Encounter: Payer: Self-pay | Admitting: Nurse Practitioner

## 2022-10-24 ENCOUNTER — Ambulatory Visit (INDEPENDENT_AMBULATORY_CARE_PROVIDER_SITE_OTHER): Payer: 59 | Admitting: Nurse Practitioner

## 2022-10-24 VITALS — BP 122/78 | HR 83 | Temp 97.8°F | Ht 61.0 in | Wt 226.4 lb

## 2022-10-24 DIAGNOSIS — E785 Hyperlipidemia, unspecified: Secondary | ICD-10-CM | POA: Diagnosis not present

## 2022-10-24 DIAGNOSIS — Z23 Encounter for immunization: Secondary | ICD-10-CM

## 2022-10-24 DIAGNOSIS — Z1239 Encounter for other screening for malignant neoplasm of breast: Secondary | ICD-10-CM | POA: Diagnosis not present

## 2022-10-24 DIAGNOSIS — Z6841 Body Mass Index (BMI) 40.0 and over, adult: Secondary | ICD-10-CM | POA: Diagnosis not present

## 2022-10-24 DIAGNOSIS — Z124 Encounter for screening for malignant neoplasm of cervix: Secondary | ICD-10-CM | POA: Diagnosis not present

## 2022-10-24 NOTE — Progress Notes (Signed)
Established Patient Office Visit  Subjective   Patient ID: Leslie Fischer, female    DOB: 06-15-1966  Age: 56 y.o. MRN: [id number]  Chief Complaint  Patient presents with   Obesity    Obesity/Hyperlipidemia: Has been able to lose approximately 70 pounds with lifestyle modification.  Reports that she has been helping her mother with health issues over the last few months so has been unable to make much headway in further weight loss.  However has been able to sustain what weight she is lost thus far.  ASCVD risk were less than 10%, last LDL slightly elevated at 102.  Due for Pap smear and mammogram.  Would like referral to OB/GYN.  Also due for colon cancer screening, would still prefer to hold off on scheduling colonoscopy at this time.  Due for flu shot today.    Review of Systems  Respiratory:  Negative for shortness of breath.   Cardiovascular:  Negative for chest pain.      Objective:     BP 122/78   Pulse 83   Temp 97.8 F (36.6 C) (Oral)   Ht 5\' 1"  (1.549 m)   Wt 226 lb 6 oz (102.7 kg)   LMP 01/24/2019 (Approximate) Comment: had bleeding march 2021  SpO2 93%   BMI 42.77 kg/m  BP Readings from Last 3 Encounters:  10/24/22 122/78  04/19/22 140/90  12/11/21 109/74   Wt Readings from Last 3 Encounters:  10/24/22 226 lb 6 oz (102.7 kg)  04/19/22 227 lb (103 kg)  06/21/21 272 lb 12.8 oz (123.7 kg)      Physical Exam Vitals reviewed.  Constitutional:      General: She is not in acute distress.    Appearance: Normal appearance.  HENT:     Head: Normocephalic and atraumatic.  Neck:     Vascular: No carotid bruit.  Cardiovascular:     Rate and Rhythm: Normal rate and regular rhythm.     Pulses: Normal pulses.     Heart sounds: Normal heart sounds.  Pulmonary:     Effort: Pulmonary effort is normal.     Breath sounds: Normal breath sounds.  Skin:    General: Skin is warm and dry.  Neurological:     General: No focal deficit present.     Mental  Status: She is alert and oriented to person, place, and time.  Psychiatric:        Mood and Affect: Mood normal.        Behavior: Behavior normal.        Judgment: Judgment normal.      No results found for any visits on 10/24/22.    The 10-year ASCVD risk score (Arnett DK, et al., 2019) is: 3.8%    Assessment & Plan:   Problem List Items Addressed This Visit       Other   Class 3 severe obesity without serious comorbidity with body mass index (BMI) of 40.0 to 44.9 in adult [facility])    Encourage patient to continue focusing on lifestyle modification as she is been quite successful thus far.  Patient plans on doing so.      Hyperlipidemia    ASCVD risk score still less than 10%, last LDL 102.  Patient would prefer not to take medication to control this if possible.  Patient encouraged to continue focusing on healthy lifestyle.      Cervical cancer screening    Referral to OB/GYN made today.  Relevant Orders   Ambulatory referral to Obstetrics / Gynecology   Other Visit Diagnoses     Need for vaccination    -  Primary   Relevant Orders   Flu Vaccine QUAD 6+ mos PF IM (Fluarix Quad PF) (Completed)   Encounter for screening for malignant neoplasm of breast, unspecified screening modality       Relevant Orders   Ambulatory referral to Obstetrics / Gynecology       Return in about 6 months (around 04/24/2023) for for CPE with Maralyn Sago.    Elenore Paddy, NP

## 2022-10-24 NOTE — Assessment & Plan Note (Signed)
Referral to OBGYN made today.  

## 2022-10-24 NOTE — Patient Instructions (Addendum)
  [facility] - mostly female providers. Dr. Henderson [facility]: Dr. Vincente Poli

## 2022-10-24 NOTE — Assessment & Plan Note (Signed)
Encourage patient to continue focusing on lifestyle modification as she is been quite successful thus far.  Patient plans on doing so.

## 2022-10-24 NOTE — Assessment & Plan Note (Signed)
ASCVD risk score still less than 10%, last LDL 102.  Patient would prefer not to take medication to control this if possible.  Patient encouraged to continue focusing on healthy lifestyle.

## 2022-11-05 DIAGNOSIS — L718 Other rosacea: Secondary | ICD-10-CM | POA: Diagnosis not present

## 2022-11-05 DIAGNOSIS — D225 Melanocytic nevi of trunk: Secondary | ICD-10-CM | POA: Diagnosis not present

## 2022-11-05 DIAGNOSIS — D2361 Other benign neoplasm of skin of right upper limb, including shoulder: Secondary | ICD-10-CM | POA: Diagnosis not present

## 2022-11-05 DIAGNOSIS — L723 Sebaceous cyst: Secondary | ICD-10-CM | POA: Diagnosis not present

## 2022-11-05 DIAGNOSIS — L814 Other melanin hyperpigmentation: Secondary | ICD-10-CM | POA: Diagnosis not present

## 2022-11-05 DIAGNOSIS — Z85828 Personal history of other malignant neoplasm of skin: Secondary | ICD-10-CM | POA: Diagnosis not present

## 2023-02-04 DIAGNOSIS — Z801 Family history of malignant neoplasm of trachea, bronchus and lung: Secondary | ICD-10-CM | POA: Diagnosis not present

## 2023-02-04 DIAGNOSIS — Z800 Family history of malignant neoplasm of digestive organs: Secondary | ICD-10-CM | POA: Diagnosis not present

## 2023-02-04 DIAGNOSIS — Z803 Family history of malignant neoplasm of breast: Secondary | ICD-10-CM | POA: Diagnosis not present

## 2023-02-04 DIAGNOSIS — Z124 Encounter for screening for malignant neoplasm of cervix: Secondary | ICD-10-CM | POA: Diagnosis not present

## 2023-02-04 DIAGNOSIS — Z808 Family history of malignant neoplasm of other organs or systems: Secondary | ICD-10-CM | POA: Diagnosis not present

## 2023-03-05 ENCOUNTER — Telehealth: Payer: Self-pay | Admitting: Nurse Practitioner

## 2023-03-05 NOTE — Telephone Encounter (Signed)
Patient needs to talk to you about the coding for her November visit -  \ Please call her at:  [phone]

## 2023-03-06 ENCOUNTER — Encounter: Payer: Self-pay | Admitting: Nurse Practitioner

## 2023-03-06 NOTE — Progress Notes (Signed)
Patient contacted [place] stating that her insurance company will not cover a pap smear due to diagnosis code Z12.4 (cervical cancer screening) being attached to this office visit on 10/24/2022. This diagnosis code was attached in order to place an order for referral to OBGYN to have pap smear for cervical cancer screening completed. No pap smear was completed by myself on 10/24/2022. Per insurance company's request, I will remove diagnosis code from visit and patient will be told to call OBGYN office of her choice to schedule an appointment for cervical cancer screening.

## 2023-03-06 NOTE — Telephone Encounter (Signed)
Spoke to pt, pt stated that her insurance plan is stating that during her November visit it showing that a pap smear was done and due to that she can not get it done this year. Wanting this issue fix, let pt know I will let provider know and also let our billing department be aware of this

## 2023-03-06 NOTE — Telephone Encounter (Signed)
Spoke to the billing department, they stated that the billing code that was done during the office visit was right,code of [id number] and [phone]. There is no extra billing code that was done. Also spoke to pt insurance company they stated that the billing codes are also right, the issue that is showing is the diagnosis code for pt referral to see the OB GYN (Z12.4), it showing as preventive procedure was done. This code should be taking out and that pt dose not need referral to se an OB GYN, they can find one themselves based on their insurance plan. Have let provider be aware of that and will be sending billing team message in regards to that. Did make pt aware of he issue, pt showed understanding

## 2023-03-10 DIAGNOSIS — Z7183 Encounter for nonprocreative genetic counseling: Secondary | ICD-10-CM | POA: Diagnosis not present

## 2023-04-24 ENCOUNTER — Encounter: Payer: 59 | Admitting: Nurse Practitioner

## 2023-05-23 ENCOUNTER — Encounter: Payer: 59 | Admitting: Nurse Practitioner

## 2023-07-15 ENCOUNTER — Other Ambulatory Visit: Payer: Self-pay | Admitting: Obstetrics and Gynecology

## 2023-07-15 DIAGNOSIS — Z803 Family history of malignant neoplasm of breast: Secondary | ICD-10-CM

## 2023-07-16 ENCOUNTER — Encounter (INDEPENDENT_AMBULATORY_CARE_PROVIDER_SITE_OTHER): Payer: Self-pay

## 2023-07-17 ENCOUNTER — Encounter: Payer: 59 | Admitting: Nurse Practitioner

## 2023-08-14 ENCOUNTER — Ambulatory Visit (INDEPENDENT_AMBULATORY_CARE_PROVIDER_SITE_OTHER): Payer: 59 | Admitting: Nurse Practitioner

## 2023-08-14 VITALS — BP 122/82 | HR 70 | Temp 98.1°F | Ht 61.0 in | Wt 235.0 lb

## 2023-08-14 DIAGNOSIS — R202 Paresthesia of skin: Secondary | ICD-10-CM

## 2023-08-14 DIAGNOSIS — Z6841 Body Mass Index (BMI) 40.0 and over, adult: Secondary | ICD-10-CM | POA: Diagnosis not present

## 2023-08-14 DIAGNOSIS — Z0001 Encounter for general adult medical examination with abnormal findings: Secondary | ICD-10-CM

## 2023-08-14 DIAGNOSIS — Z1211 Encounter for screening for malignant neoplasm of colon: Secondary | ICD-10-CM | POA: Diagnosis not present

## 2023-08-14 DIAGNOSIS — R7303 Prediabetes: Secondary | ICD-10-CM

## 2023-08-14 DIAGNOSIS — E785 Hyperlipidemia, unspecified: Secondary | ICD-10-CM

## 2023-08-14 LAB — COMPREHENSIVE METABOLIC PANEL
ALT: 15 U/L (ref 0–35)
AST: 19 U/L (ref 0–37)
Albumin: 4.2 g/dL (ref 3.5–5.2)
Alkaline Phosphatase: 56 U/L (ref 39–117)
BUN: 18 mg/dL (ref 6–23)
CO2: 26 mEq/L (ref 19–32)
Calcium: 9.4 mg/dL (ref 8.4–10.5)
Chloride: 105 mEq/L (ref 96–112)
Creatinine, Ser: 0.83 mg/dL (ref 0.40–1.20)
GFR: 77.04 mL/min (ref >60.00)
Glucose, Bld: 88 mg/dL (ref 70–99)
Potassium: 4.2 mEq/L (ref 3.5–5.1)
Sodium: 139 mEq/L (ref 135–145)
Total Bilirubin: 0.3 mg/dL (ref 0.2–1.2)
Total Protein: 6.6 g/dL (ref 6.0–8.3)

## 2023-08-14 LAB — TSH: TSH: 0.94 u[IU]/mL (ref 0.35–5.50)

## 2023-08-14 LAB — LIPID PANEL
Cholesterol: 176 mg/dL (ref 0–200)
HDL: 59.6 mg/dL (ref >39.00)
LDL Cholesterol: 99 mg/dL (ref 0–99)
NonHDL: 116.31
Total CHOL/HDL Ratio: 3
Triglycerides: 85 mg/dL (ref 0.0–149.0)
VLDL: 17 mg/dL (ref 0.0–40.0)

## 2023-08-14 LAB — VITAMIN B12: Vitamin B-12: 584 pg/mL (ref 211–911)

## 2023-08-14 LAB — CBC
HCT: 40.7 % (ref 36.0–46.0)
Hemoglobin: 13.1 g/dL (ref 12.0–15.0)
MCHC: 32.3 g/dL (ref 30.0–36.0)
MCV: 89.8 fl (ref 78.0–100.0)
Platelets: 284 10*3/uL (ref 150.0–400.0)
RBC: 4.53 Mil/uL (ref 3.87–5.11)
RDW: 13.4 % (ref 11.5–15.5)
WBC: 5.2 10*3/uL (ref 4.0–10.5)

## 2023-08-14 LAB — HEMOGLOBIN A1C: Hgb A1c MFr Bld: 5.7 % (ref 4.6–6.5)

## 2023-08-14 LAB — MAGNESIUM: Magnesium: 2.1 mg/dL (ref 1.5–2.5)

## 2023-08-14 NOTE — Assessment & Plan Note (Signed)
Chronic, intermittent, getting progressively worse Grip strength equal and strong bilaterally today. Referral to neurology made today for assistance with evaluation.

## 2023-08-14 NOTE — Assessment & Plan Note (Signed)
Overall exam within normal limits.  Discussed health maintenance recommendations.  She is following up with OB/GYN regarding Pap smears and mammograms.  She is overdue for colon cancer screening, she is agreeable to referral to Dr. Karilyn Cota (order placed today).  She has already completed STD and hep C screenings. Handout regarding health promotion and prevention provided to patient today.

## 2023-08-14 NOTE — Assessment & Plan Note (Signed)
Labs ordered, further recommendations may be made based upon his results. 

## 2023-08-14 NOTE — Progress Notes (Signed)
Complete physical exam  Patient: Leslie Fischer   DOB: Aug 12, 1966   57 y.o. Female  MRN: [id number]  Subjective:    Chief Complaint  Patient presents with   Annual Exam    Leslie Fischer is a 57 y.o. female who presents today for a complete physical exam. She reports consuming a  general diet, does meal supplements around 5 days/week with shakes and does IF 16 hours a day  diet.  Exercise: recumbent bike 2 hours/day and strength with exercise bands  She generally feels well. She reports sleeping fairly well. She does have additional problems to discuss today.   Paresthesias: Onset 4 months ago, occurs in bilateral hands.  It is intermittent, initially only bother her at night.  Now is occurring during the daytime.  Aggravating factors include certain positions.  Patient unable to describe specific positions that trigger the numbness, but does notice when changing positions once numbness occurs this will improve.  She states she might have noticed some grip strength decline, but this could also just be from difficulty feeling.  Denies any recent or history of trauma to her cervical spine.  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  Most recent fall risk assessment:    08/14/2023    8:39 AM  Fall Risk   Falls in the past year? 0  Number falls in past yr: 0  Injury with Fall? 0  Risk for fall due to : No Fall Risks  Follow up Falls evaluation completed     Most recent depression screenings:    08/14/2023    8:41 AM 10/24/2022    1:14 PM  PHQ 2/9 Scores  PHQ - 2 Score 0 0  PHQ- 9 Score  0    Vision:Within last year and Dental: No current dental problems and Receives regular dental care  Patient Active Problem List   Diagnosis Date Noted   Paresthesia 08/14/2023   Colon cancer screening 08/14/2023   Prediabetes 08/14/2023   Cervical cancer screening 10/24/2022   Fatigue 04/19/2022   Dysuria 04/19/2022   Mass of left lower leg 04/19/2022   Encounter for general  adult medical examination with abnormal findings 04/19/2022   Hyperlipidemia 07/03/2020   Encounter for screening fecal occult blood testing 06/15/2020   Encounter for gynecological examination with Papanicolaou smear of cervix 06/15/2020   SHINGLES 03/14/2009   SINUSITIS- ACUTE-NOS 01/17/2009   Class 3 severe obesity without serious comorbidity with body mass index (BMI) of 40.0 to 44.9 in adult [facility]) 06/06/2008   COMMON MIGRAINE 06/06/2008   Past Medical History:  Diagnosis Date   Arthritis    Headache(784.0)    IBS (irritable bowel syndrome)    No pertinent past medical history    Rosacea    Face   Past Surgical History:  Procedure Laterality Date   CHOLECYSTECTOMY     KNEE ARTHROSCOPY  1/12   rt    KNEE ARTHROSCOPY  2007   lt    MOHS SURGERY  12/25/2020   Has had several places frozen, 2 areas on back and 1 area on right arm   Social History   Socioeconomic History   Marital status: Widowed    Spouse name: Not on file   Number of children: Not on file   Years of education: Not on file   Highest education level: Not on file  Occupational History   Not on file  Tobacco Use   Smoking status: Never   Smokeless tobacco: Never  Vaping Use   Vaping status: Never Used  Substance and Sexual Activity   Alcohol use: No   Drug use: No   Sexual activity: Not Currently    Birth control/protection: Post-menopausal  Other Topics Concern   Not on file  Social History Narrative   Widow.   Social Determinants of Health   Financial Resource Strain: Low Risk  (06/15/2020)   Overall Financial Resource Strain (CARDIA)    Difficulty of Paying Living Expenses: Not very hard  Food Insecurity: No Food Insecurity (06/15/2020)   Hunger Vital Sign    Worried About Running Out of Food in the Last Year: Never true    Ran Out of Food in the Last Year: Never true  Transportation Needs: No Transportation Needs (06/15/2020)   PRAPARE - Administrator, Civil Service (Medical): No     Lack of Transportation (Non-Medical): No  Physical Activity: Insufficiently Active (06/15/2020)   Exercise Vital Sign    Days of Exercise per Week: 3 days    Minutes of Exercise per Session: 10 min  Stress: No Stress Concern Present (06/15/2020)   [employer] of Occupational Health - Occupational Stress Questionnaire    Feeling of Stress : Not at all  Social Connections: Moderately Isolated (06/15/2020)   Social Connection and Isolation Panel [NHANES]    Frequency of Communication with Friends and Family: More than three times a week    Frequency of Social Gatherings with Friends and Family: Once a week    Attends Religious Services: 1 to 4 times per year    Active Member of [employer] or Organizations: No    Attends Banker Meetings: Never    Marital Status: Widowed  Intimate Partner Violence: Not At Risk (06/15/2020)   Humiliation, Afraid, Rape, and Kick questionnaire    Fear of Current or Ex-Partner: No    Emotionally Abused: No    Physically Abused: No    Sexually Abused: No   Family History  Problem Relation Age of Onset   Hypertension Mother    Cancer Father    Aneurysm Maternal Grandfather    Hypertension Brother    Hypertension Sister    Thyroid nodules Sister    Aneurysm Maternal Aunt    Hypertension Brother    Cancer Brother    Other Brother        desert storm issues   Hypertension Brother    Other Sister        suicide   Breast cancer Sister    No Known Allergies    Patient Care Team: Elenore Paddy, NP as PCP - General (Nurse Practitioner)   Outpatient Medications Prior to Visit  Medication Sig   acetaminophen (TYLENOL) 500 MG tablet Take 500 mg by mouth every 6 (six) hours as needed for mild pain. As needed.   Ascorbic Acid (VITAMIN C) 1000 MG tablet Take 1,000 mg by mouth daily.   Calcium Carb-Cholecalciferol (CALTRATE 600+D3) 600-800 MG-UNIT TABS Take by mouth.   diphenhydrAMINE (BENADRYL) 25 MG tablet Take 25 mg by mouth every 6 (six)  hours as needed for allergies.   doxycycline (ADOXA) 100 MG tablet Take 100 mg by mouth daily.   loratadine (CLARITIN) 10 MG tablet Take 10 mg by mouth daily.   Multiple Vitamin (MULTIVITAMIN WITH MINERALS) TABS tablet Take 1 tablet by mouth daily.   naproxen sodium (ALEVE) 220 MG tablet Take 220 mg by mouth.   Probiotic Product (TRUBIOTICS PO) Take by mouth.   zinc gluconate  50 MG tablet Take 50 mg by mouth daily.   [DISCONTINUED] benzonatate (TESSALON) 100 MG capsule Take 1 capsule (100 mg total) by mouth 3 (three) times daily as needed for cough.   [DISCONTINUED] fluticasone (FLONASE) 50 MCG/ACT nasal spray Place 2 sprays into both nostrils daily.   No facility-administered medications prior to visit.    Review of Systems  Constitutional:  Negative for fever.  HENT:  Negative for ear pain and hearing loss.   Eyes:  Negative for blurred vision and double vision.  Respiratory:  Negative for cough and shortness of breath.   Cardiovascular:  Negative for chest pain and palpitations.  Gastrointestinal:  Negative for abdominal pain and blood in stool.  Neurological:  Negative for dizziness and headaches.  Psychiatric/Behavioral:  Negative for depression and suicidal ideas. The patient is not nervous/anxious.           Objective:     BP 122/82   Pulse 70   Temp 98.1 F (36.7 C) (Temporal)   Ht 5\' 1"  (1.549 m)   Wt 235 lb (106.6 kg)   LMP 01/24/2019 (Approximate) Comment: had bleeding march 2021  SpO2 98%   BMI 44.40 kg/m  BP Readings from Last 3 Encounters:  08/14/23 122/82  10/24/22 122/78  04/19/22 140/90   Wt Readings from Last 3 Encounters:  08/14/23 235 lb (106.6 kg)  10/24/22 226 lb 6 oz (102.7 kg)  04/19/22 227 lb (103 kg)      Physical Exam Vitals reviewed.  Constitutional:      Appearance: Normal appearance.  HENT:     Head: Normocephalic and atraumatic.     Right Ear: Tympanic membrane, ear canal and external ear normal.     Left Ear: Tympanic  membrane, ear canal and external ear normal.  Eyes:     General:        Right eye: No discharge.        Left eye: No discharge.     Extraocular Movements: Extraocular movements intact.     Conjunctiva/sclera: Conjunctivae normal.     Pupils: Pupils are equal, round, and reactive to light.  Neck:     Vascular: No carotid bruit.  Cardiovascular:     Rate and Rhythm: Normal rate and regular rhythm.     Pulses: Normal pulses.     Heart sounds: Normal heart sounds. No murmur heard. Pulmonary:     Effort: Pulmonary effort is normal.     Breath sounds: Normal breath sounds.  Chest:     Comments: Breast exam deferred per patient preference Abdominal:     General: Abdomen is flat. Bowel sounds are normal. There is no distension.     Palpations: Abdomen is soft. There is no mass.     Tenderness: There is no abdominal tenderness.  Musculoskeletal:        General: No tenderness.     Cervical back: Neck supple. No muscular tenderness.     Right lower leg: No edema.     Left lower leg: No edema.  Lymphadenopathy:     Cervical: No cervical adenopathy.     Upper Body:     Right upper body: No supraclavicular adenopathy.     Left upper body: No supraclavicular adenopathy.  Skin:    General: Skin is warm and dry.  Neurological:     General: No focal deficit present.     Mental Status: She is alert and oriented to person, place, and time.     Motor: No weakness.  Gait: Gait normal.  Psychiatric:        Mood and Affect: Mood normal.        Behavior: Behavior normal.        Judgment: Judgment normal.      No results found for any visits on 08/14/23.     Assessment & Plan:    Routine Health Maintenance and Physical Exam  Immunization History  Administered Date(s) Administered   Influenza,inj,Quad PF,6+ Mos 10/24/2022   Influenza-Unspecified 11/02/2020, 10/15/2021   PFIZER(Purple Top)SARS-COV-2 Vaccination 03/09/2020, 04/07/2020   Pfizer Covid-19 Vaccine Bivalent Booster 65yrs  & up 02/15/2021, 08/02/2021   Td 06/06/2008   Tdap 07/03/2020    Health Maintenance  Topic Date Due   FOOT EXAM  Never done   OPHTHALMOLOGY EXAM  Never done   Colonoscopy  Never done   Zoster Vaccines- Shingrix (1 of 2) Never done   COLON CANCER SCREENING ANNUAL FOBT  01/16/2022   COVID-19 Vaccine (5 - 2023-24 season) 08/16/2022   HEMOGLOBIN A1C  10/20/2022   Diabetic kidney evaluation - eGFR measurement  04/20/2023   Diabetic kidney evaluation - Urine ACR  04/20/2023   MAMMOGRAM  08/09/2023   PAP SMEAR-Modifier  06/16/2023   INFLUENZA VACCINE  07/17/2023   DTaP/Tdap/Td (3 - Td or Tdap) 07/03/2030   Hepatitis C Screening  Completed   HIV Screening  Completed   HPV VACCINES  Aged Out    Discussed health benefits of physical activity, and encouraged her to engage in regular exercise appropriate for her age and condition.  Problem List Items Addressed This Visit       Other   Class 3 severe obesity without serious comorbidity with body mass index (BMI) of 40.0 to 44.9 in adult [facility])   Relevant Orders   CBC   Comprehensive metabolic panel   Hemoglobin A1c   Lipid panel   TSH   Magnesium   Vitamin B12   Hyperlipidemia   Relevant Orders   CBC   Comprehensive metabolic panel   Hemoglobin A1c   Lipid panel   TSH   Magnesium   Vitamin B12   Encounter for general adult medical examination with abnormal findings - Primary    Overall exam within normal limits.  Discussed health maintenance recommendations.  She is following up with OB/GYN regarding Pap smears and mammograms.  She is overdue for colon cancer screening, she is agreeable to referral to Dr. Karilyn Cota (order placed today).  She has already completed STD and hep C screenings. Handout regarding health promotion and prevention provided to patient today.      Relevant Orders   Magnesium   Vitamin B12   Paresthesia    Chronic, intermittent, getting progressively worse Grip strength equal and strong bilaterally  today. Referral to neurology made today for assistance with evaluation.      Relevant Orders   Ambulatory referral to Neurology   CBC   Comprehensive metabolic panel   Hemoglobin A1c   Lipid panel   TSH   Magnesium   Vitamin B12   Colon cancer screening    Referral to GI made today.      Relevant Orders   Ambulatory referral to Gastroenterology   CBC   Comprehensive metabolic panel   Hemoglobin A1c   Lipid panel   TSH   Magnesium   Vitamin B12   Prediabetes    Labs ordered, further recommendations may be made based upon his results        Relevant Orders  CBC   Comprehensive metabolic panel   Hemoglobin A1c   Lipid panel   TSH   Magnesium   Vitamin B12   Return in about 6 months (around 02/13/2024) for F/U with Maralyn Sago.     Elenore Paddy, NP

## 2023-08-14 NOTE — Assessment & Plan Note (Signed)
Referral to GI made today. 

## 2023-08-19 ENCOUNTER — Telehealth: Payer: Self-pay | Admitting: Internal Medicine

## 2023-08-19 ENCOUNTER — Encounter: Payer: Self-pay | Admitting: *Deleted

## 2023-08-19 ENCOUNTER — Encounter (INDEPENDENT_AMBULATORY_CARE_PROVIDER_SITE_OTHER): Payer: Self-pay | Admitting: *Deleted

## 2023-08-19 ENCOUNTER — Encounter: Payer: Self-pay | Admitting: Nurse Practitioner

## 2023-08-19 NOTE — Telephone Encounter (Signed)
Noted, will call patient when start scheduling referrals

## 2023-08-19 NOTE — Telephone Encounter (Signed)
Patient left a message re: referral that was sent ... She wants to schedule an appt... The referral looks like it may have come to [facility].

## 2023-08-21 ENCOUNTER — Encounter: Payer: Self-pay | Admitting: Obstetrics and Gynecology

## 2023-08-21 ENCOUNTER — Encounter: Payer: Self-pay | Admitting: Neurology

## 2023-08-21 NOTE — Addendum Note (Signed)
Addended by: Jiles Prows E on: 08/21/2023 12:55 PM   Modules accepted: Orders

## 2023-08-21 NOTE — Addendum Note (Signed)
Addended by: Jiles Prows E on: 08/21/2023 12:59 PM   Modules accepted: Orders

## 2023-08-27 NOTE — Progress Notes (Signed)
Initial neurology clinic note  Reason for Evaluation: Consultation requested by Elenore Paddy, NP for an opinion regarding paresthesias in hands. My final recommendations will be communicated back to the requesting physician by way of shared medical record or letter to requesting physician via [place] mail.  HPI: This is Ms. Sharlyne Cai, a 57 y.o. right-handed female with a medical history of OA, IBS, HLD, pre-DM who presents to neurology clinic with the chief complaint of paresthesias in hands. The patient is alone today.  Patient's symptoms started in the left arm around 03/2023. She would wake up at night and feel like her left arm was asleep when she was laying on it. When she would roll over, it would go ahead. Around late 06/2023, she noticed symptoms in both hands. It is tingling and numbness. It was not just when she would wake up though. When she would wake up, it would go up into her arms and feel like a burning.  She realized in mid-August, that is she would lay on her stomach with arms straight and the numbness or tingling would go away. It is more likely to occur at night or while driving. The symptoms can vary and may exclude the thumb or maybe next time exclude the 5th digit. She denies neck pain.  When her hands are numb, it is difficult to using her hands. She denies difficulty opening bottles. She will occasionally drop things.   She takes daily tylenol and alleve for OA, but has not taken anything for her numbness and tingling.  She denies any numbness and tingling in her legs. She does wear compression socks due to swelling in her legs.  Patient takes a lot of vitamins: a multivitamin, zinc (during winter to avoid sickness), calcium, vit C, potassium if she has cramps, vit D3. She does not take a B complex.  She does not report any constitutional symptoms like fever, night sweats, anorexia or unintentional weight loss.  EtOH use: very rare  Restrictive diet? Reduced  calorie intake (1300 calories per day) Family history of neuropathy/myopathy/neurologic disease? Cousin with MS  She has never had an EMG.   MEDICATIONS:  Outpatient Encounter Medications as of 09/04/2023  Medication Sig   acetaminophen (TYLENOL) 500 MG tablet Take 500 mg by mouth every 6 (six) hours as needed for mild pain. As needed.   Ascorbic Acid (VITAMIN C) 1000 MG tablet Take 1,000 mg by mouth daily.   Calcium Carb-Cholecalciferol (CALTRATE 600+D3) 600-800 MG-UNIT TABS Take by mouth.   Cholecalciferol (VITAMIN D3) 125 MCG (5000 UT) CAPS    diphenhydrAMINE (BENADRYL) 25 MG tablet Take 25 mg by mouth every 6 (six) hours as needed for allergies.   doxycycline (ADOXA) 100 MG tablet Take 100 mg by mouth daily.   loratadine (CLARITIN) 10 MG tablet Take 10 mg by mouth daily.   Multiple Vitamin (MULTIVITAMIN WITH MINERALS) TABS tablet Take 1 tablet by mouth daily.   naproxen sodium (ALEVE) 220 MG tablet Take 220 mg by mouth.   Probiotic Product (TRUBIOTICS PO) Take by mouth.   zinc gluconate 50 MG tablet Take 50 mg by mouth daily.   No facility-administered encounter medications on file as of 09/04/2023.    PAST MEDICAL HISTORY: Past Medical History:  Diagnosis Date   Arthritis    Headache(784.0)    IBS (irritable bowel syndrome)    No pertinent past medical history    Rosacea    Face    PAST SURGICAL HISTORY: Past Surgical History:  Procedure Laterality Date   CHOLECYSTECTOMY     KNEE ARTHROSCOPY  1/12   rt    KNEE ARTHROSCOPY  2007   lt    MOHS SURGERY  12/25/2020   Has had several places frozen, 2 areas on back and 1 area on right arm    ALLERGIES: No Known Allergies  FAMILY HISTORY: Family History  Problem Relation Age of Onset   Hypertension Mother    Cancer Father    Aneurysm Maternal Grandfather    Hypertension Brother    Hypertension Sister    Thyroid nodules Sister    Aneurysm Maternal Aunt    Hypertension Brother    Cancer Brother    Other Brother         desert storm issues   Hypertension Brother    Other Sister        suicide   Breast cancer Sister     SOCIAL HISTORY: Social History   Tobacco Use   Smoking status: Never   Smokeless tobacco: Never  Vaping Use   Vaping status: Never Used  Substance Use Topics   Alcohol use: No   Drug use: No   Social History   Social History Narrative   Widow. 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       Are you right handed or left handed? Right Handed   Are you currently employed ? No    What is your current occupation? Retired    Do you live at home alone? Yes   Who lives with you?    What type of home do you live in: 1 story or 2 story? Lives in an apartment on the second floor.          OBJECTIVE: PHYSICAL EXAM: BP 125/85   Pulse 88   Ht 5\' 2"  (1.575 m)   Wt 240 lb (108.9 kg)   LMP 01/24/2019 (Approximate) Comment: had bleeding march 2021  SpO2 95%   BMI 43.90 kg/m   General: General appearance: Awake and alert. No distress. Cooperative with exam.  Skin: No obvious rash or jaundice. HEENT: Atraumatic. Anicteric. Lungs: Non-labored breathing on room air  Extremities: Peripheral edema in bilateral lower extremities. Psych: Affect appropriate.  Neurological: Mental Status: Alert. Speech fluent. No pseudobulbar affect Cranial Nerves: CNII: No RAPD. Visual fields grossly intact. CNIII, IV, VI: PERRL. No nystagmus. EOMI. CN V: Facial sensation intact bilaterally to fine touch. CN VII: Facial muscles symmetric and strong. No ptosis at rest. CN VIII: Hearing grossly intact bilaterally. CN IX: No hypophonia. CN X: Palate elevates symmetrically. CN XI: Full strength shoulder shrug bilaterally. CN XII: Tongue protrusion full and midline. No atrophy or fasciculations. No significant dysarthria Motor: Tone is normal. No significant atrophy.  Individual muscle group testing (MRC grade out of 5):  Movement     Neck flexion 5    Neck extension 5     Right Left   Shoulder  abduction 5 5   Shoulder adduction 5 5   Shoulder ext rotation 5 5   Shoulder int rotation 5 5   Elbow flexion 5 5   Elbow extension 5 5   Wrist extension 5 5   Wrist flexion 5 5   Finger abduction - FDI 5 5   Finger abduction - ADM 5 5   Finger extension 5 5   Finger distal flexion - 2/3 5 5    Finger distal flexion - 4/5 5 5    Thumb flexion - FPL 5 5   Thumb  abduction - APB 5 5-    Hip flexion 5 5   Hip extension 5 5   Hip adduction 5 5   Hip abduction 5 5   Knee extension 5 5   Knee flexion 5 5   Dorsiflexion 5 5   Plantarflexion 5 5    Reflexes: 2+ throughout Pathological Reflexes: Hoffman: absent bilaterally Troemner: absent bilaterally Sensation: Phalen and Tinel's test negative at bilateral carpal tunnel and cubital tunnel. Pinprick: Intact in all extremities including bilateral hands. Coordination: Intact finger-to- nose-finger bilaterally. Romberg negative. Gait: Able to rise from chair with arms crossed unassisted. Normal, narrow-based gait.  Lab and Test Review: Internal labs: 08/14/23: CBC unremarkable CMP unremarkable HbA1c: 5.7 TSH wnl Mg: 2.1 B12: 584 Lipid panel: Component     Latest Ref Rng 08/14/2023  Cholesterol     0 - 200 mg/dL 161   Triglycerides     0.0 - 149.0 mg/dL 09.6   HDL Cholesterol     >39.00 mg/dL 04.54   VLDL     0.0 - 40.0 mg/dL 09.8   LDL (calc)     0 - 99 mg/dL 99   Total CHOL/HDL Ratio 3   NonHDL 116.31     ASSESSMENT: Liliann Derks is a 58 y.o. female who presents for evaluation of bilateral hand numbness and tingling. She has a relevant medical history of OA, IBS, HLD, pre-DM. Her neurological examination is essentially normal today (equivocal weakness of left APB). The etiology of patient's symptoms is currently unclear, but the differential including carpal tunnel syndrome, ulnar nerve compression at the elbow, or cervical radiculopathy (less likely given no neck pain). I will get an EMG to further  investigate.  PLAN: -EMG of bilateral upper extremities -Discussed nerve pain medications, but patient preferred to defer for now (does not feel like she needs currently)  -Return to clinic to be determined  The impression above as well as the plan as outlined below were extensively discussed with the patient who voiced understanding. All questions were answered to their satisfaction.  When available, results of the above investigations and possible further recommendations will be communicated to the patient via telephone/MyChart. Patient to call office if not contacted after expected testing turnaround time.   Total time spent reviewing records, interview, history/exam, documentation, and coordination of care on day of encounter:  45 min   Thank you for allowing me to participate in patient's care.  If I can answer any additional questions, I would be pleased to do so.  Jacquelyne Balint, MD   CC: Elenore Paddy, NP [address] [place] [address]  CC: Referring provider: Elenore Paddy, NP [address] [place],  [place] [address]

## 2023-08-30 ENCOUNTER — Ambulatory Visit
Admission: RE | Admit: 2023-08-30 | Discharge: 2023-08-30 | Disposition: A | Discharge status: Home / Self Care | Payer: 59 | Source: Ambulatory Visit | Attending: Obstetrics and Gynecology | Admitting: Obstetrics and Gynecology

## 2023-08-30 DIAGNOSIS — Z803 Family history of malignant neoplasm of breast: Secondary | ICD-10-CM

## 2023-08-30 DIAGNOSIS — Z1239 Encounter for other screening for malignant neoplasm of breast: Secondary | ICD-10-CM | POA: Diagnosis not present

## 2023-08-30 MED ORDER — GADOPICLENOL 0.5 MMOL/ML IV SOLN
10.0000 mL | Freq: Once | INTRAVENOUS | Status: AC | PRN
  Administered 2023-08-30: 10 mL via INTRAVENOUS

## 2023-09-04 ENCOUNTER — Ambulatory Visit: Payer: 59 | Admitting: Neurology

## 2023-09-04 ENCOUNTER — Encounter: Payer: Self-pay | Admitting: Neurology

## 2023-09-04 VITALS — BP 125/85 | HR 88 | Ht 62.0 in | Wt 240.0 lb

## 2023-09-04 DIAGNOSIS — R200 Anesthesia of skin: Secondary | ICD-10-CM | POA: Diagnosis not present

## 2023-09-04 DIAGNOSIS — R202 Paresthesia of skin: Secondary | ICD-10-CM

## 2023-09-04 NOTE — Patient Instructions (Signed)
I saw you today for numbness and tingling in the hands. I think this is probably a nerve compression, but which is currently unclear. Carpal tunnel is the most common, but it is not clear if this is causing your symptoms. I will investigate with a muscle and nerve test called EMG (see below for more details). We will discuss next steps after your EMG.  Cock up wrist splint for carpal tunnel symptoms can be bought in local drug stores or online. This should especially be worn at night while sleeping.      Recommend the following measures that may provide some symptomatic benefit for muscle twitching and/or cramps: - Adequate oral clear fluid intake to maintain optimal hydration (about 2.5 liters, or around 8-10 glasses per day) Avoidance of caffeine Trial of DIET tonic water: About 1 glass, up to 6 times daily Magnesium oxide up to 400 mg by mouth twice daily, as needed (over the counter) Gentle muscle stretching routine, especially before bedtime   The physicians and staff at [facility] are committed to providing excellent care. You may receive a survey requesting feedback about your experience at our office. We strive to receive "very good" responses to the survey questions. If you feel that your experience would prevent you from giving the office a "very good " response, please contact our office to try to remedy the situation. We may be reached at [phone]. Thank you for taking the time out of your busy day to complete the survey.  Jacquelyne Balint, MD [facility]  ELECTROMYOGRAM AND NERVE CONDUCTION STUDIES (EMG/NCS) INSTRUCTIONS  How to Prepare The neurologist conducting the EMG will need to know if you have certain medical conditions. Tell the neurologist and other EMG lab personnel if you: Have a pacemaker or any other electrical medical device Take blood-thinning medications Have hemophilia, a blood-clotting disorder that causes prolonged bleeding Bathing Take a  shower or bath shortly before your exam in order to remove oils from your skin. Don't apply lotions or creams before the exam.  What to Expect You'll likely be asked to change into a hospital gown for the procedure and lie down on an examination table. The following explanations can help you understand what will happen during the exam.  Electrodes. The neurologist or a technician places surface electrodes at various locations on your skin depending on where you're experiencing symptoms. Or the neurologist may insert needle electrodes at different sites depending on your symptoms.  Sensations. The electrodes will at times transmit a tiny electrical current that you may feel as a twinge or spasm. The needle electrode may cause discomfort or pain that usually ends shortly after the needle is removed. If you are concerned about discomfort or pain, you may want to talk to the neurologist about taking a short break during the exam.  Instructions. During the needle EMG, the neurologist will assess whether there is any spontaneous electrical activity when the muscle is at rest - activity that isn't present in healthy muscle tissue - and the degree of activity when you slightly contract the muscle.  He or she will give you instructions on resting and contracting a muscle at appropriate times. Depending on what muscles and nerves the neurologist is examining, he or she may ask you to change positions during the exam.  After your EMG You may experience some temporary, minor bruising where the needle electrode was inserted into your muscle. This bruising should fade within several days. If it persists, contact your primary care  doctor.

## 2023-09-08 ENCOUNTER — Telehealth: Payer: Self-pay | Admitting: Internal Medicine

## 2023-09-08 NOTE — Telephone Encounter (Signed)
QUESTIONNAIRE is in review.

## 2023-09-16 NOTE — Telephone Encounter (Signed)
  Procedure: Colonoscopy (requests Dr. Jena Gauss)  Height: 5'2 Weight: 230lbs        Have you had a colonoscopy before?  no  Do you have family history of colon cancer?  Yes brother  Do you have a family history of polyps? no  Previous colonoscopy with polyps removed? no  Do you have a history colorectal cancer?   no  Are you diabetic?  no  Do you have a prosthetic or mechanical heart valve? no  Do you have a pacemaker/defibrillator?   no  Have you had endocarditis/atrial fibrillation?  no  Do you use supplemental oxygen/CPAP?  no  Have you had joint replacement within the last 12 months?  no  Do you tend to be constipated or have to use laxatives?  no   Do you have history of alcohol use? If yes, how much and how often.  no  Do you have history or are you using drugs? If yes, what do are you  using?  no  Have you ever had a stroke/heart attack?  no  Have you ever had a heart or other vascular stent placed,?no  Do you take weight loss medication? no  female patients,: have you had a hysterectomy? no                              are you post menopausal?  yes                              do you still have your menstrual cycle? no    Date of last menstrual period?   Do you take any blood-thinning medications such as: (Plavix, aspirin, Coumadin, Aggrenox, Brilinta, Xarelto, Eliquis, Pradaxa, Savaysa or Effient)? no  If yes we need the name, milligram, dosage and who is prescribing doctor:               Current Outpatient Medications  Medication Sig Dispense Refill   acetaminophen (TYLENOL) 500 MG tablet Take 500 mg by mouth every 6 (six) hours as needed for mild pain. As needed.     Ascorbic Acid (VITAMIN C) 1000 MG tablet Take 1,000 mg by mouth daily.     Cholecalciferol (VITAMIN D3) 125 MCG (5000 UT) CAPS      diphenhydrAMINE (BENADRYL) 25 MG tablet Take 25 mg by mouth every 6 (six) hours as needed for allergies.     doxycycline (ADOXA) 100 MG tablet Take 100 mg by  mouth daily. As needed for rosacea     loratadine (CLARITIN) 10 MG tablet Take 10 mg by mouth daily.     MAGNESIUM PO Take by mouth daily.     Multiple Vitamin (MULTIVITAMIN WITH MINERALS) TABS tablet Take 1 tablet by mouth daily.     naproxen sodium (ALEVE) 220 MG tablet Take 220 mg by mouth.     Potassium 99 MG TABS Take by mouth daily.     No current facility-administered medications for this visit.    No Known Allergies

## 2023-09-16 NOTE — Addendum Note (Signed)
Addended by: Armstead Peaks on: 09/16/2023 09:11 AM   Modules accepted: Orders

## 2023-09-19 ENCOUNTER — Other Ambulatory Visit: Payer: Self-pay | Admitting: Nurse Practitioner

## 2023-09-19 DIAGNOSIS — Z1212 Encounter for screening for malignant neoplasm of rectum: Secondary | ICD-10-CM

## 2023-09-19 DIAGNOSIS — Z1211 Encounter for screening for malignant neoplasm of colon: Secondary | ICD-10-CM

## 2023-09-19 NOTE — Telephone Encounter (Signed)
Ok to schedule. ASA 3 (due to BMI of 42).

## 2023-09-22 ENCOUNTER — Other Ambulatory Visit: Payer: Self-pay | Admitting: *Deleted

## 2023-09-22 ENCOUNTER — Encounter: Payer: Self-pay | Admitting: *Deleted

## 2023-09-22 MED ORDER — PEG 3350-KCL-NA BICARB-NACL 420 G PO SOLR: 4000.0000 mL | Freq: Once | ORAL | 0 refills | Status: DC

## 2023-09-22 NOTE — Telephone Encounter (Signed)
Pt has been scheduled for 10/29/23 with Dr.Rourk, instructions mailed to pt and prep sent to the pharmacy.

## 2023-09-22 NOTE — Telephone Encounter (Signed)
LMOVM to call back 

## 2023-09-23 ENCOUNTER — Encounter: Payer: Self-pay | Admitting: *Deleted

## 2023-09-23 NOTE — Telephone Encounter (Signed)
Referral completed, TCS apt letter sent to PCP

## 2023-10-14 ENCOUNTER — Encounter: Payer: 59 | Admitting: Neurology

## 2023-10-20 ENCOUNTER — Ambulatory Visit: Payer: 59 | Admitting: Neurology

## 2023-10-20 ENCOUNTER — Telehealth: Payer: Self-pay | Admitting: Neurology

## 2023-10-20 DIAGNOSIS — R200 Anesthesia of skin: Secondary | ICD-10-CM

## 2023-10-20 DIAGNOSIS — R202 Paresthesia of skin: Secondary | ICD-10-CM | POA: Diagnosis not present

## 2023-10-20 DIAGNOSIS — G5603 Carpal tunnel syndrome, bilateral upper limbs: Secondary | ICD-10-CM

## 2023-10-20 NOTE — Procedures (Signed)
[facility]  [address] [place], [address]  [place], [place] [address] Tel: [phone] Fax: [phone] Test Date:  10/20/2023  Patient: Leslie Fischer DOB: 1966/10/13 Physician: Jacquelyne Balint, MD  Sex: Female Height: 5\' 2"  Ref Phys: Jacquelyne Balint, MD  ID#: [id number]   Technician:    History: This is a 57 year old female with paresthesias in hands.  NCV & EMG Findings: Extensive electrodiagnostic evaluation of bilateral upper limbs shows: Bilateral median sensory responses show prolonged distal peak latency (L4.9, R5.9 ms) and reduced amplitude (L8, R7 V). Bilateral ulnar and radial sensory responses are within normal limits. Right median (APB) motor response shows prolonged distal onset latency (6.9 ms) and reduced amplitude (5.7 mV). Left median (APB) motor nerve shows prolonged distal onset latency (4.6 ms). Bilateral ulnar (ADM) motor responses are within normal limits. There is no evidence of active or chronic motor axon loss changes affecting Leslie of the tested muscles on needle examination. Motor unit configuration and recruitment pattern is within normal limits.  Impression: This is an abnormal study. The findings are most consistent with the following: Bilateral median mononeuropathy at or distal to the wrist, consistent with carpal tunnel syndrome, moderate in degree electrically (right worse than left). No electrodiagnostic evidence of a right or left cervical (C5-C8) motor radiculopathy. Screening studies for right or left ulnar or radial mononeuropathies are normal.    ___________________________ Jacquelyne Balint, MD    Nerve Conduction Studies Motor Nerve Results    Latency Amplitude F-Lat Segment Distance CV Comment  Site (ms) Norm (mV) Norm (ms)  (cm) (m/s) Norm   Left Median (APB) Motor  Wrist *4.6  < 4.0 6.1  > 6.0        Elbow 9.3 - 5.7 -  Elbow-Wrist 25 53  > 50   Right Median (APB) Motor  Wrist *6.9  < 4.0 *5.7  > 6.0        Elbow 11.7 - 5.3 -   Elbow-Wrist 24 50  > 50   Left Ulnar (ADM) Motor  Wrist 1.98  < 3.1 10.5  > 7.0        Bel elbow 5.5 - 8.8 -  Bel elbow-Wrist 21 60  > 50   Ab elbow 7.1 - 8.6 -  Ab elbow-Bel elbow 10 63 -   Right Ulnar (ADM) Motor  Wrist 2.3  < 3.1 8.5  > 7.0        Bel elbow 5.7 - 7.9 -  Bel elbow-Wrist 20 59  > 50   Ab elbow 7.3 - 7.6 -  Ab elbow-Bel elbow 10 63 -    Sensory Sites    Neg Peak Lat Amplitude (O-P) Segment Distance Velocity Comment  Site (ms) Norm (V) Norm  (cm) (ms)   Left Median Sensory  Wrist-Dig II *4.9  < 3.6 *8  > 15 Wrist-Dig II 13    Right Median Sensory  Wrist-Dig II *5.9  < 3.6 *7  > 15 Wrist-Dig II 13    Left Radial Sensory  Forearm-Wrist 2.1  < 2.7 18  > 14 Forearm-Wrist 10    Right Radial Sensory  Forearm-Wrist 2.3  < 2.7 17  > 14 Forearm-Wrist 10    Left Ulnar Sensory  Wrist-Dig V 2.8  < 3.1 21  > 10 Wrist-Dig V 11    Right Ulnar Sensory  Wrist-Dig V 2.8  < 3.1 17  > 10 Wrist-Dig V 11     Electromyography   Side Muscle Ins.Act Fibs  Fasc Recrt Amp Dur Poly Activation Comment  Right FDI Nml Nml Nml Nml Nml Nml Nml Nml N/A  Right EIP Nml Nml Nml Nml Nml Nml Nml Nml N/A  Right FPL Nml Nml Nml Nml Nml Nml Nml Nml N/A  Right Pronator teres Nml Nml Nml Nml Nml Nml Nml Nml N/A  Right Biceps Nml Nml Nml Nml Nml Nml Nml Nml N/A  Right Triceps Nml Nml Nml Nml Nml Nml Nml Nml N/A  Right Deltoid Nml Nml Nml Nml Nml Nml Nml Nml N/A  Left FDI Nml Nml Nml Nml Nml Nml Nml Nml N/A  Left EIP Nml Nml Nml Nml Nml Nml Nml Nml N/A  Left Pronator teres Nml Nml Nml Nml Nml Nml Nml Nml N/A  Left Biceps Nml Nml Nml Nml Nml Nml Nml Nml N/A  Left Triceps Nml Nml Nml Nml Nml Nml Nml Nml N/A  Left Deltoid Nml Nml Nml Nml Nml Nml Nml Nml N/A      Waveforms:  Motor           Sensory

## 2023-10-20 NOTE — Telephone Encounter (Signed)
Discussed the results of patient's EMG after the procedure today. It showed bilateral carpal tunnel syndrome, moderate in degree electrically, slightly worse on the right than the left. Patient to wear wrist splints and will be in touch if her symptoms persist.  All questions were answered.  Jacquelyne Balint, MD [facility]

## 2023-10-22 ENCOUNTER — Encounter: Payer: Self-pay | Admitting: Nurse Practitioner

## 2023-10-24 ENCOUNTER — Encounter (HOSPITAL_COMMUNITY)
Admission: RE | Admit: 2023-10-24 | Discharge: 2023-10-24 | Disposition: A | Discharge status: Home / Self Care | Payer: 59 | Source: Ambulatory Visit | Attending: Internal Medicine | Admitting: Internal Medicine

## 2023-10-24 ENCOUNTER — Encounter (HOSPITAL_COMMUNITY): Payer: Self-pay

## 2023-10-27 ENCOUNTER — Telehealth: Payer: Self-pay | Admitting: *Deleted

## 2023-10-27 NOTE — Telephone Encounter (Signed)
Pt left vm regarding procedure on 10/29/23  Spoke to pt and she said she woke up in middle of night with a runny nose. She said she has a cough, headache and low grade temp. She doesn't have a tempeture at this time. Did a covid test, that was negative.  Advised pt that I spoke with one of the providers and says it's up to her if she wanted to reschedule and that she could take some allergy medication and she how she feels. Pt states she took some nyquil and going to lay back down. She will call back this afternoon to let us know what she plans on doing. She will do another covid test also.

## 2023-10-27 NOTE — Telephone Encounter (Signed)
Pt called back to cancel her procedure on 10/29/23. She states she thinks she has the flu.  Advised pt that will have to call to get her scheduled in January (due to being an ASA 3) will call once we get providers schedule. Verbalized understanding.

## 2023-10-29 ENCOUNTER — Encounter (HOSPITAL_COMMUNITY): Admission: RE | Payer: Self-pay | Source: Home / Self Care

## 2023-10-29 ENCOUNTER — Ambulatory Visit (HOSPITAL_COMMUNITY): Admission: RE | Admit: 2023-10-29 | Payer: 59 | Source: Home / Self Care | Admitting: Internal Medicine

## 2023-10-29 SURGERY — COLONOSCOPY WITH PROPOFOL
Anesthesia: Monitor Anesthesia Care

## 2023-11-03 DIAGNOSIS — D2271 Melanocytic nevi of right lower limb, including hip: Secondary | ICD-10-CM | POA: Diagnosis not present

## 2023-11-03 DIAGNOSIS — L718 Other rosacea: Secondary | ICD-10-CM | POA: Diagnosis not present

## 2023-11-03 DIAGNOSIS — D1801 Hemangioma of skin and subcutaneous tissue: Secondary | ICD-10-CM | POA: Diagnosis not present

## 2023-11-03 DIAGNOSIS — D485 Neoplasm of uncertain behavior of skin: Secondary | ICD-10-CM | POA: Diagnosis not present

## 2023-11-03 DIAGNOSIS — D2272 Melanocytic nevi of left lower limb, including hip: Secondary | ICD-10-CM | POA: Diagnosis not present

## 2023-11-03 DIAGNOSIS — Z85828 Personal history of other malignant neoplasm of skin: Secondary | ICD-10-CM | POA: Diagnosis not present

## 2023-11-03 DIAGNOSIS — L821 Other seborrheic keratosis: Secondary | ICD-10-CM | POA: Diagnosis not present

## 2023-11-03 DIAGNOSIS — D0359 Melanoma in situ of other part of trunk: Secondary | ICD-10-CM | POA: Diagnosis not present

## 2023-11-03 DIAGNOSIS — L820 Inflamed seborrheic keratosis: Secondary | ICD-10-CM | POA: Diagnosis not present

## 2023-11-20 DIAGNOSIS — L988 Other specified disorders of the skin and subcutaneous tissue: Secondary | ICD-10-CM | POA: Diagnosis not present

## 2023-11-20 DIAGNOSIS — D0359 Melanoma in situ of other part of trunk: Secondary | ICD-10-CM | POA: Diagnosis not present

## 2023-11-24 ENCOUNTER — Other Ambulatory Visit: Payer: Self-pay | Admitting: *Deleted

## 2023-11-24 ENCOUNTER — Encounter: Payer: Self-pay | Admitting: *Deleted

## 2023-11-24 MED ORDER — PEG 3350-KCL-NA BICARB-NACL 420 G PO SOLR: 4000.0000 mL | Freq: Once | ORAL | 0 refills | Status: AC

## 2023-11-25 ENCOUNTER — Encounter: Payer: Self-pay | Admitting: *Deleted

## 2023-12-04 DIAGNOSIS — L7211 Pilar cyst: Secondary | ICD-10-CM | POA: Diagnosis not present

## 2024-01-02 ENCOUNTER — Encounter (HOSPITAL_COMMUNITY)
Admission: RE | Admit: 2024-01-02 | Discharge: 2024-01-02 | Disposition: A | Discharge status: Home / Self Care | Payer: 59 | Source: Ambulatory Visit | Attending: Internal Medicine | Admitting: Internal Medicine

## 2024-01-02 ENCOUNTER — Encounter (HOSPITAL_COMMUNITY): Payer: Self-pay

## 2024-01-02 VITALS — BP 114/75 | HR 81 | Temp 97.7°F | Resp 18 | Ht 62.0 in | Wt 240.1 lb

## 2024-01-02 DIAGNOSIS — Z6841 Body Mass Index (BMI) 40.0 and over, adult: Secondary | ICD-10-CM | POA: Diagnosis not present

## 2024-01-02 DIAGNOSIS — E66813 Obesity, class 3: Secondary | ICD-10-CM | POA: Insufficient documentation

## 2024-01-02 DIAGNOSIS — Z01810 Encounter for preprocedural cardiovascular examination: Secondary | ICD-10-CM | POA: Diagnosis not present

## 2024-01-07 ENCOUNTER — Encounter (HOSPITAL_COMMUNITY)
Admission: RE | Disposition: A | Discharge status: Home / Self Care | Payer: Self-pay | Source: Home / Self Care | Attending: Internal Medicine

## 2024-01-07 ENCOUNTER — Encounter (HOSPITAL_COMMUNITY): Payer: Self-pay | Admitting: Internal Medicine

## 2024-01-07 ENCOUNTER — Ambulatory Visit (HOSPITAL_BASED_OUTPATIENT_CLINIC_OR_DEPARTMENT_OTHER): Payer: 59 | Admitting: Anesthesiology

## 2024-01-07 ENCOUNTER — Ambulatory Visit (HOSPITAL_COMMUNITY): Payer: 59 | Admitting: Anesthesiology

## 2024-01-07 ENCOUNTER — Ambulatory Visit (HOSPITAL_COMMUNITY)
Admission: RE | Admit: 2024-01-07 | Discharge: 2024-01-07 | Disposition: A | Discharge status: Home / Self Care | Payer: 59 | Attending: Internal Medicine | Admitting: Internal Medicine

## 2024-01-07 ENCOUNTER — Other Ambulatory Visit: Payer: Self-pay

## 2024-01-07 DIAGNOSIS — K5730 Diverticulosis of large intestine without perforation or abscess without bleeding: Secondary | ICD-10-CM

## 2024-01-07 DIAGNOSIS — E785 Hyperlipidemia, unspecified: Secondary | ICD-10-CM

## 2024-01-07 DIAGNOSIS — Z1211 Encounter for screening for malignant neoplasm of colon: Secondary | ICD-10-CM | POA: Diagnosis not present

## 2024-01-07 DIAGNOSIS — K589 Irritable bowel syndrome without diarrhea: Secondary | ICD-10-CM | POA: Insufficient documentation

## 2024-01-07 DIAGNOSIS — E66813 Obesity, class 3: Secondary | ICD-10-CM | POA: Insufficient documentation

## 2024-01-07 DIAGNOSIS — I10 Essential (primary) hypertension: Secondary | ICD-10-CM

## 2024-01-07 DIAGNOSIS — Z800 Family history of malignant neoplasm of digestive organs: Secondary | ICD-10-CM | POA: Insufficient documentation

## 2024-01-07 DIAGNOSIS — Z6841 Body Mass Index (BMI) 40.0 and over, adult: Secondary | ICD-10-CM | POA: Diagnosis not present

## 2024-01-07 HISTORY — PX: COLONOSCOPY WITH PROPOFOL: SHX5780

## 2024-01-07 SURGERY — COLONOSCOPY WITH PROPOFOL
Anesthesia: General

## 2024-01-07 MED ORDER — LIDOCAINE HCL (PF) 2 % IJ SOLN
INTRAMUSCULAR | Status: DC | PRN
  Administered 2024-01-07: 50 mg via INTRADERMAL

## 2024-01-07 MED ORDER — LACTATED RINGERS IV SOLN: INTRAVENOUS | Status: DC | PRN

## 2024-01-07 MED ORDER — PROPOFOL 10 MG/ML IV BOLUS
INTRAVENOUS | Status: DC | PRN
  Administered 2024-01-07: 100 mg via INTRAVENOUS

## 2024-01-07 MED ORDER — PROPOFOL 500 MG/50ML IV EMUL
INTRAVENOUS | Status: DC | PRN
  Administered 2024-01-07: 150 ug/kg/min via INTRAVENOUS

## 2024-01-07 MED ORDER — LACTATED RINGERS IV SOLN: INTRAVENOUS | Status: DC

## 2024-01-07 NOTE — H&P (Signed)
@LOGO @   Primary Care Physician:  Elenore Paddy, NP Primary Gastroenterologist:  Dr. Geni Bers  Pre-Procedure History & Physical: HPI:  Leslie Fischer is a 58 y.o. female here for   For screening colonoscopy.  Brother with colon cancer.  No prior colonoscopy no bowel symptoms.  Past Medical History:  Diagnosis Date   Arthritis    Headache(784.0)    IBS (irritable bowel syndrome)    No pertinent past medical history    Rosacea    Face    Past Surgical History:  Procedure Laterality Date   CHOLECYSTECTOMY     KNEE ARTHROSCOPY  12/2010   rt    KNEE ARTHROSCOPY  2007   lt    MELANOMA EXCISION     back   MOHS SURGERY  12/25/2020   Has had several places frozen, 2 areas on back and 1 area on right arm    Prior to Admission medications   Medication Sig Start Date End Date Taking? Authorizing Provider  acetaminophen (TYLENOL) 500 MG tablet Take 500 mg by mouth every 6 (six) hours as needed for mild pain. As needed.   Yes [provider]  Ascorbic Acid (VITAMIN C) 1000 MG tablet Take 1,000 mg by mouth daily.   Yes [provider]  Cholecalciferol (VITAMIN D3) 125 MCG (5000 UT) CAPS    Yes [provider]  diclofenac Sodium (VOLTAREN) 1 % GEL Apply topically 4 (four) times daily.   Yes [provider]  diphenhydrAMINE (BENADRYL) 25 MG tablet Take 25 mg by mouth every 6 (six) hours as needed for allergies.   Yes [provider]  doxycycline (ADOXA) 100 MG tablet Take 100 mg by mouth daily. As needed for rosacea 10/16/22  Yes [provider]  loratadine (CLARITIN) 10 MG tablet Take 10 mg by mouth daily.   Yes [provider]  Multiple Vitamin (MULTIVITAMIN WITH MINERALS) TABS tablet Take 1 tablet by mouth daily.   Yes [provider]  naproxen sodium (ALEVE) 220 MG tablet Take 220 mg by mouth.   Yes [provider]  MAGNESIUM PO Take by mouth daily.    [provider]  Potassium 99 MG TABS Take by  mouth daily.    [provider]    Allergies as of 11/24/2023   (No Known Allergies)    Family History  Problem Relation Age of Onset   Hypertension Mother    Cancer Father    Aneurysm Maternal Grandfather    Hypertension Brother    Hypertension Sister    Thyroid nodules Sister    Aneurysm Maternal Aunt    Hypertension Brother    Cancer Brother    Other Brother        desert storm issues   Hypertension Brother    Other Sister        suicide   Breast cancer Sister     Social History   Socioeconomic History   Marital status: Widowed    Spouse name: Not on file   Number of children: Not on file   Years of education: Not on file   Highest education level: Not on file  Occupational History   Not on file  Tobacco Use   Smoking status: Never   Smokeless tobacco: Never  Vaping Use   Vaping status: Never Used  Substance and Sexual Activity   Alcohol use: No   Drug use: No   Sexual activity: Not Currently    Birth control/protection: Post-menopausal  Other  Topics Concern   Not on file  Social History Narrative   Widow. 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       Are you right handed or left handed? Right Handed   Are you currently employed ? No    What is your current occupation? Retired    Do you live at home alone? Yes   Who lives with you?    What type of home do you live in: 1 story or 2 story? Lives in an apartment on the second floor.        Social Drivers of Corporate investment banker Strain: Low Risk  (06/15/2020)   Overall Financial Resource Strain (CARDIA)    Difficulty of Paying Living Expenses: Not very hard  Food Insecurity: No Food Insecurity (06/15/2020)   Hunger Vital Sign    Worried About Running Out of Food in the Last Year: Never true    Ran Out of Food in the Last Year: Never true  Transportation Needs: No Transportation Needs (06/15/2020)   PRAPARE - Administrator, Civil Service (Medical): No    Lack of Transportation  (Non-Medical): No  Physical Activity: Insufficiently Active (06/15/2020)   Exercise Vital Sign    Days of Exercise per Week: 3 days    Minutes of Exercise per Session: 10 min  Stress: No Stress Concern Present (06/15/2020)   [employer] of Occupational Health - Occupational Stress Questionnaire    Feeling of Stress : Not at all  Social Connections: Moderately Isolated (06/15/2020)   Social Connection and Isolation Panel [NHANES]    Frequency of Communication with Friends and Family: More than three times a week    Frequency of Social Gatherings with Friends and Family: Once a week    Attends Religious Services: 1 to 4 times per year    Active Member of [employer] or Organizations: No    Attends Banker Meetings: Never    Marital Status: Widowed  Intimate Partner Violence: Not At Risk (06/15/2020)   Humiliation, Afraid, Rape, and Kick questionnaire    Fear of Current or Ex-Partner: No    Emotionally Abused: No    Physically Abused: No    Sexually Abused: No    Review of Systems: See HPI, otherwise negative ROS  Physical Exam: BP 134/71 (BP Location: Left Arm)   Pulse 79   Temp 98.8 F (37.1 C)   Resp 18   LMP 01/24/2019 (Approximate) Comment: had bleeding march 2021  SpO2 100%  General:   Alert,  Well-developed, well-nourished, pleasant and cooperative in NAD Neck:  Supple; no masses or thyromegaly. No significant cervical adenopathy. Lungs:  Clear throughout to auscultation.   No wheezes, crackles, or rhonchi. No acute distress. Heart:  Regular rate and rhythm; no murmurs, clicks, rubs,  or gallops. Abdomen: Non-distended, normal bowel sounds.  Soft and nontender without appreciable mass or hepatosplenomegaly.   Impression/Plan:    58 year old lady here for first ever high risk screening colonoscopy Brother with colon cancer diagnosed at age 52.  No bowel symptoms. The risks, benefits, limitations, alternatives and imponderables have been reviewed with the patient.  Questions have been answered. All parties are agreeable.       Notice: This dictation was prepared with Dragon dictation along with smaller phrase technology. Any transcriptional errors that result from this process are unintentional and may not be corrected upon review.

## 2024-01-07 NOTE — Discharge Instructions (Signed)
  Colonoscopy Discharge Instructions  Read the instructions outlined below and refer to this sheet in the next few weeks. These discharge instructions provide you with general information on caring for yourself after you leave the hospital. Your doctor may also give you specific instructions. While your treatment has been planned according to the most current medical practices available, unavoidable complications occasionally occur. If you have any problems or questions after discharge, call Dr. Jena Gauss at [phone]. ACTIVITY You may resume your regular activity, but move at a slower pace for the next 24 hours.  Take frequent rest periods for the next 24 hours.  Walking will help get rid of the air and reduce the bloated feeling in your belly (abdomen).  No driving for 24 hours (because of the medicine (anesthesia) used during the test).   Do not sign any important legal documents or operate any machinery for 24 hours (because of the anesthesia used during the test).  NUTRITION Drink plenty of fluids.  You may resume your normal diet as instructed by your doctor.  Begin with a light meal and progress to your normal diet. Heavy or fried foods are harder to digest and may make you feel sick to your stomach (nauseated).  Avoid alcoholic beverages for 24 hours or as instructed.  MEDICATIONS You may resume your normal medications unless your doctor tells you otherwise.  WHAT YOU CAN EXPECT TODAY Some feelings of bloating in the abdomen.  Passage of more gas than usual.  Spotting of blood in your stool or on the toilet paper.  IF YOU HAD POLYPS REMOVED DURING THE COLONOSCOPY: No aspirin products for 7 days or as instructed.  No alcohol for 7 days or as instructed.  Eat a soft diet for the next 24 hours.  FINDING OUT THE RESULTS OF YOUR TEST Not all test results are available during your visit. If your test results are not back during the visit, make an appointment with your caregiver to find out the  results. Do not assume everything is normal if you have not heard from your caregiver or the medical facility. It is important for you to follow up on all of your test results.  SEEK IMMEDIATE MEDICAL ATTENTION IF: You have more than a spotting of blood in your stool.  Your belly is swollen (abdominal distention).  You are nauseated or vomiting.  You have a temperature over 101.  You have abdominal pain or discomfort that is severe or gets worse throughout the day.     No polyps found today.  You do have diverticulosis on the left side of your colon.  It is recommended you return for repeat colonoscopy in 5 years.  At patient request, called Deirdre Evener at [phone]-reviewed findings and recommendations

## 2024-01-07 NOTE — Anesthesia Preprocedure Evaluation (Signed)
Anesthesia Evaluation  Patient identified by MRN, date of birth, ID band Patient awake    Reviewed: Allergy & Precautions, H&P , NPO status , Patient's Chart, lab work & pertinent test results, reviewed documented beta blocker date and time   Airway Mallampati: II  TM Distance: >3 FB Neck ROM: full    Dental no notable dental hx.    Pulmonary neg pulmonary ROS   Pulmonary exam normal breath sounds clear to auscultation       Cardiovascular Exercise Tolerance: Good hypertension, negative cardio ROS  Rhythm:regular Rate:Normal     Neuro/Psych  Headaches  negative psych ROS   GI/Hepatic negative GI ROS, Neg liver ROS,,,  Endo/Other    Class 4 obesity  Renal/GU negative Renal ROS  negative genitourinary   Musculoskeletal   Abdominal   Peds  Hematology negative hematology ROS (+)   Anesthesia Other Findings   Reproductive/Obstetrics negative OB ROS                             Anesthesia Physical Anesthesia Plan  ASA: 3  Anesthesia Plan: General   Post-op Pain Management:    Induction:   PONV Risk Score and Plan: Propofol infusion  Airway Management Planned:   Additional Equipment:   Intra-op Plan:   Post-operative Plan:   Informed Consent: I have reviewed the patients History and Physical, chart, labs and discussed the procedure including the risks, benefits and alternatives for the proposed anesthesia with the patient or authorized representative who has indicated his/her understanding and acceptance.     Dental Advisory Given  Plan Discussed with: CRNA  Anesthesia Plan Comments:        Anesthesia Quick Evaluation

## 2024-01-07 NOTE — Op Note (Signed)
[facility] Patient Name: Leslie Fischer Procedure Date: 01/07/2024 1:38 PM MRN: [id number] Date of Birth: Dec 20, 1965 Attending MD: Gennette Pac , MD, [id number] CSN: [id number] Age: 58 Admit Type: Outpatient Procedure:                Colonoscopy Indications:              Screening in patient at increased risk: Family                            history of 1st-degree relative with colorectal                            cancer Providers:                Gennette Pac, MD, Buel Ream. Thomasena Edis RN, RN,                            Elinor Parkinson Referring MD:             Gennette Pac, MD Medicines:                Propofol per Anesthesia Complications:            No immediate complications. Estimated Blood Loss:     Estimated blood loss: none. Procedure:                Pre-Anesthesia Assessment:                           - Prior to the procedure, a History and Physical                            was performed, and patient medications and                            allergies were reviewed. The patient's tolerance of                            previous anesthesia was also reviewed. The risks                            and benefits of the procedure and the sedation                            options and risks were discussed with the patient.                            All questions were answered, and informed consent                            was obtained. Prior Anticoagulants: The patient has                            taken no anticoagulant or antiplatelet agents. ASA  Grade Assessment: III - A patient with severe                            systemic disease. After reviewing the risks and                            benefits, the patient was deemed in satisfactory                            condition to undergo the procedure.                           After obtaining informed consent, the colonoscope                            was passed under  direct vision. Throughout the                            procedure, the patient's blood pressure, pulse, and                            oxygen saturations were monitored continuously. The                            [id number]) scope was introduced through the                            anus and advanced to the the cecum, identified by                            appendiceal orifice and ileocecal valve. The                            colonoscopy was performed without difficulty. The                            patient tolerated the procedure well. The quality                            of the bowel preparation was adequate. The                            ileocecal valve, appendiceal orifice, and rectum                            were photographed. The entire colon was well                            visualized. The colonoscopy was performed without                            difficulty. The patient tolerated the procedure  well. The quality of the bowel preparation was                            adequate. Scope In: 2:17:40 PM Scope Out: 2:41:01 PM Scope Withdrawal Time: 0 hours 7 minutes 50 seconds  Total Procedure Duration: 0 hours 23 minutes 21 seconds  Findings:      The perianal and digital rectal examinations were normal.      Multiple medium-mouthed diverticula were found in the sigmoid colon and       descending colon.      The exam was otherwise without abnormality on direct and retroflexion       views. Impression:               - Diverticulosis in the sigmoid colon and in the                            descending colon.                           - The examination was otherwise normal on direct                            and retroflexion views.                           - No specimens collected. Moderate Sedation:      Moderate (conscious) sedation was personally administered by an       anesthesia professional. The following parameters were  monitored: oxygen       saturation, heart rate, blood pressure, respiratory rate, EKG, adequacy       of pulmonary ventilation, and response to care. Recommendation:           - Patient has a contact number available for                            emergencies. The signs and symptoms of potential                            delayed complications were discussed with the                            patient. Return to normal activities tomorrow.                            Written discharge instructions were provided to the                            patient.                           - Advance diet as tolerated.                           - Continue present medications.                           - Repeat colonoscopy in  5 years for screening                            purposes.                           - Return to GI office (date not yet determined). Procedure Code(s):        --- Professional ---                           [phone], Colonoscopy, flexible; diagnostic, including                            collection of specimen(s) by brushing or washing,                            when performed (separate procedure) Diagnosis Code(s):        --- Professional ---                           Z80.0, Family history of malignant neoplasm of                            digestive organs                           K57.30, Diverticulosis of large intestine without                            perforation or abscess without bleeding CPT copyright 2022 American Medical Association. All rights reserved. The codes documented in this report are preliminary and upon coder review may  be revised to meet current compliance requirements. Gerrit Friends. Digna Countess, MD Gennette Pac, MD 01/07/2024 2:51:51 PM This report has been signed electronically. Number of Addenda: 0

## 2024-01-07 NOTE — Anesthesia Procedure Notes (Signed)
Date/Time: 01/07/2024 2:10 PM  Performed by: Julian Reil, CRNAPre-anesthesia Checklist: Patient identified, Emergency Drugs available and Patient being monitored Patient Re-evaluated:Patient Re-evaluated prior to induction Oxygen Delivery Method: Nasal cannula Induction Type: IV induction Placement Confirmation: positive ETCO2 Comments: Optiflow High Flow [place] O2 used.

## 2024-01-07 NOTE — Transfer of Care (Signed)
Immediate Anesthesia Transfer of Care Note  Patient: Leslie Fischer  Procedure(s) Performed: COLONOSCOPY WITH PROPOFOL  Patient Location: Short Stay  Anesthesia Type:General  Level of Consciousness: drowsy  Airway & Oxygen Therapy: Patient Spontanous Breathing  Post-op Assessment: Report given to RN and Post -op Vital signs reviewed and stable  Post vital signs: Reviewed and stable  Last Vitals:  Vitals Value Taken Time  BP    Temp    Pulse    Resp    SpO2      Last Pain:  Vitals:   01/07/24 1245  PainSc: 0-No pain         Complications: No notable events documented.

## 2024-01-08 ENCOUNTER — Encounter (HOSPITAL_COMMUNITY): Payer: Self-pay | Admitting: Internal Medicine

## 2024-01-12 NOTE — Anesthesia Postprocedure Evaluation (Signed)
Anesthesia Post Note  Patient: Leslie Fischer  Procedure(s) Performed: COLONOSCOPY WITH PROPOFOL  Patient location during evaluation: Phase II Anesthesia Type: General Level of consciousness: awake Pain management: pain level controlled Vital Signs Assessment: post-procedure vital signs reviewed and stable Respiratory status: spontaneous breathing and respiratory function stable Cardiovascular status: blood pressure returned to baseline and stable Postop Assessment: no headache and no apparent nausea or vomiting Anesthetic complications: no Comments: Late entry   No notable events documented.   Last Vitals:  Vitals:   01/07/24 1443 01/07/24 1448  BP: 98/68 104/65  Pulse: 74   Resp: 20   Temp: 36.6 C   SpO2: 93%     Last Pain:  Vitals:   01/07/24 1443  TempSrc: Oral  PainSc: Asleep                 Windell Norfolk

## 2024-02-13 ENCOUNTER — Ambulatory Visit: Payer: 59 | Admitting: Nurse Practitioner

## 2024-03-02 DIAGNOSIS — Z1231 Encounter for screening mammogram for malignant neoplasm of breast: Secondary | ICD-10-CM | POA: Diagnosis not present

## 2024-03-02 DIAGNOSIS — Z01419 Encounter for gynecological examination (general) (routine) without abnormal findings: Secondary | ICD-10-CM | POA: Diagnosis not present

## 2024-03-05 ENCOUNTER — Ambulatory Visit: Payer: 59 | Admitting: Nurse Practitioner

## 2024-03-12 ENCOUNTER — Ambulatory Visit: Payer: 59 | Admitting: Nurse Practitioner

## 2024-03-31 ENCOUNTER — Ambulatory Visit: Admitting: Nurse Practitioner

## 2024-04-15 ENCOUNTER — Ambulatory Visit: Admitting: Nurse Practitioner

## 2024-04-29 DIAGNOSIS — L821 Other seborrheic keratosis: Secondary | ICD-10-CM | POA: Diagnosis not present

## 2024-04-29 DIAGNOSIS — L723 Sebaceous cyst: Secondary | ICD-10-CM | POA: Diagnosis not present

## 2024-04-29 DIAGNOSIS — L718 Other rosacea: Secondary | ICD-10-CM | POA: Diagnosis not present

## 2024-04-29 DIAGNOSIS — D1801 Hemangioma of skin and subcutaneous tissue: Secondary | ICD-10-CM | POA: Diagnosis not present

## 2024-04-29 DIAGNOSIS — L905 Scar conditions and fibrosis of skin: Secondary | ICD-10-CM | POA: Diagnosis not present

## 2024-04-29 DIAGNOSIS — Z85820 Personal history of malignant melanoma of skin: Secondary | ICD-10-CM | POA: Diagnosis not present

## 2024-04-29 DIAGNOSIS — D2272 Melanocytic nevi of left lower limb, including hip: Secondary | ICD-10-CM | POA: Diagnosis not present

## 2024-04-29 DIAGNOSIS — D2262 Melanocytic nevi of left upper limb, including shoulder: Secondary | ICD-10-CM | POA: Diagnosis not present

## 2024-04-29 DIAGNOSIS — D225 Melanocytic nevi of trunk: Secondary | ICD-10-CM | POA: Diagnosis not present

## 2024-04-29 DIAGNOSIS — D2271 Melanocytic nevi of right lower limb, including hip: Secondary | ICD-10-CM | POA: Diagnosis not present

## 2024-04-29 DIAGNOSIS — Z85828 Personal history of other malignant neoplasm of skin: Secondary | ICD-10-CM | POA: Diagnosis not present

## 2024-05-07 ENCOUNTER — Ambulatory Visit: Admitting: Nurse Practitioner

## 2024-05-14 ENCOUNTER — Ambulatory Visit: Admitting: Nurse Practitioner

## 2024-05-21 ENCOUNTER — Ambulatory Visit: Admitting: Nurse Practitioner

## 2024-08-05 ENCOUNTER — Other Ambulatory Visit: Payer: Self-pay | Admitting: Obstetrics and Gynecology

## 2024-08-05 DIAGNOSIS — Z803 Family history of malignant neoplasm of breast: Secondary | ICD-10-CM

## 2024-09-03 ENCOUNTER — Other Ambulatory Visit

## 2024-09-08 ENCOUNTER — Encounter: Payer: Self-pay | Admitting: Obstetrics and Gynecology

## 2024-09-17 ENCOUNTER — Ambulatory Visit
Admission: RE | Admit: 2024-09-17 | Discharge: 2024-09-17 | Disposition: A | Discharge status: Home / Self Care | Source: Ambulatory Visit | Attending: Obstetrics and Gynecology | Admitting: Obstetrics and Gynecology

## 2024-09-17 DIAGNOSIS — Z803 Family history of malignant neoplasm of breast: Secondary | ICD-10-CM

## 2024-09-17 DIAGNOSIS — Z1239 Encounter for other screening for malignant neoplasm of breast: Secondary | ICD-10-CM | POA: Diagnosis not present

## 2024-09-17 MED ORDER — GADOPICLENOL 0.5 MMOL/ML IV SOLN
10.0000 mL | Freq: Once | INTRAVENOUS | Status: AC | PRN
  Administered 2024-09-17: 10 mL via INTRAVENOUS

## 2024-11-03 DIAGNOSIS — L723 Sebaceous cyst: Secondary | ICD-10-CM | POA: Diagnosis not present

## 2024-11-03 DIAGNOSIS — Z85828 Personal history of other malignant neoplasm of skin: Secondary | ICD-10-CM | POA: Diagnosis not present

## 2024-11-03 DIAGNOSIS — D2362 Other benign neoplasm of skin of left upper limb, including shoulder: Secondary | ICD-10-CM | POA: Diagnosis not present

## 2024-11-03 DIAGNOSIS — L905 Scar conditions and fibrosis of skin: Secondary | ICD-10-CM | POA: Diagnosis not present

## 2024-11-03 DIAGNOSIS — L821 Other seborrheic keratosis: Secondary | ICD-10-CM | POA: Diagnosis not present

## 2024-11-03 DIAGNOSIS — L718 Other rosacea: Secondary | ICD-10-CM | POA: Diagnosis not present

## 2024-11-03 DIAGNOSIS — Z85820 Personal history of malignant melanoma of skin: Secondary | ICD-10-CM | POA: Diagnosis not present

## 2024-11-03 DIAGNOSIS — D2361 Other benign neoplasm of skin of right upper limb, including shoulder: Secondary | ICD-10-CM | POA: Diagnosis not present

## 2024-11-26 ENCOUNTER — Ambulatory Visit: Admitting: Nurse Practitioner

## 2024-11-26 VITALS — BP 130/82 | HR 82 | Temp 97.9°F | Ht 62.0 in | Wt 248.2 lb

## 2024-11-26 DIAGNOSIS — R7303 Prediabetes: Secondary | ICD-10-CM

## 2024-11-26 DIAGNOSIS — E785 Hyperlipidemia, unspecified: Secondary | ICD-10-CM | POA: Diagnosis not present

## 2024-11-26 DIAGNOSIS — Z0001 Encounter for general adult medical examination with abnormal findings: Secondary | ICD-10-CM

## 2024-11-26 DIAGNOSIS — Z6841 Body Mass Index (BMI) 40.0 and over, adult: Secondary | ICD-10-CM | POA: Diagnosis not present

## 2024-11-26 DIAGNOSIS — Z23 Encounter for immunization: Secondary | ICD-10-CM

## 2024-11-26 DIAGNOSIS — E66813 Obesity, class 3: Secondary | ICD-10-CM | POA: Diagnosis not present

## 2024-11-26 DIAGNOSIS — Z0000 Encounter for general adult medical examination without abnormal findings: Secondary | ICD-10-CM | POA: Diagnosis not present

## 2024-11-26 LAB — COMPREHENSIVE METABOLIC PANEL WITH GFR
ALT: 15 U/L (ref 0–35)
AST: 19 U/L (ref 0–37)
Albumin: 4.4 g/dL (ref 3.5–5.2)
Alkaline Phosphatase: 63 U/L (ref 39–117)
BUN: 17 mg/dL (ref 6–23)
CO2: 27 meq/L (ref 19–32)
Calcium: 9.5 mg/dL (ref 8.4–10.5)
Chloride: 105 meq/L (ref 96–112)
Creatinine, Ser: 0.83 mg/dL (ref 0.40–1.20)
GFR: 76.35 mL/min (ref >60.00)
Glucose, Bld: 84 mg/dL (ref 70–99)
Potassium: 4.2 meq/L (ref 3.5–5.1)
Sodium: 140 meq/L (ref 135–145)
Total Bilirubin: 0.4 mg/dL (ref 0.2–1.2)
Total Protein: 6.7 g/dL (ref 6.0–8.3)

## 2024-11-26 LAB — CBC
HCT: 37.3 % (ref 36.0–46.0)
Hemoglobin: 12.7 g/dL (ref 12.0–15.0)
MCHC: 34.1 g/dL (ref 30.0–36.0)
MCV: 87.4 fl (ref 78.0–100.0)
Platelets: 270 K/uL (ref 150.0–400.0)
RBC: 4.27 Mil/uL (ref 3.87–5.11)
RDW: 13.4 % (ref 11.5–15.5)
WBC: 6.7 K/uL (ref 4.0–10.5)

## 2024-11-26 LAB — LIPID PANEL
Cholesterol: 194 mg/dL (ref 0–200)
HDL: 65.7 mg/dL (ref >39.00)
LDL Cholesterol: 110 mg/dL — ABNORMAL HIGH (ref 0–99)
NonHDL: 127.9
Total CHOL/HDL Ratio: 3
Triglycerides: 90 mg/dL (ref 0.0–149.0)
VLDL: 18 mg/dL (ref 0.0–40.0)

## 2024-11-26 LAB — TSH: TSH: 0.78 u[IU]/mL (ref 0.35–5.50)

## 2024-11-26 LAB — BRAIN NATRIURETIC PEPTIDE: Pro B Natriuretic peptide (BNP): 30 pg/mL (ref 0.0–100.0)

## 2024-11-26 LAB — HEMOGLOBIN A1C: Hgb A1c MFr Bld: 5.7 % (ref 4.6–6.5)

## 2024-11-26 NOTE — Progress Notes (Signed)
 Complete physical exam  Patient: Leslie Fischer   DOB: [birth year]   58 y.o. Female  MRN: [id number]  Subjective:    Chief Complaint  Patient presents with   Prediabetes    Leslie Fischer is a 58 y.o. female who presents today for a complete physical exam.   Discussed the use of AI scribe software for clinical note transcription with the patient, who gave verbal consent to proceed.  History of Present Illness Leslie Fischer is a 58 year old female who presents with weight gain and fluid retention.  Peripheral edema - Fluid retention primarily around knees and shins - Wears compression socks, which reduce swelling - Concerned about possible cardiac etiology due to family history - No chest pain, palpitations, wheezing, cough, exertional fatigue, orthopnea, or paroxysmal nocturnal dyspnea  Prediabetes - History of prediabetes with prior hemoglobin A1c of 5.7    Most recent fall risk assessment:    09/04/2023   12:52 PM  Fall Risk   Falls in the past year? 0  Number falls in past yr: 0  Injury with Fall? 0   Follow up Falls evaluation completed     Data saved with a previous flowsheet row definition     Most recent depression screenings:    08/14/2023    8:41 AM 10/24/2022    1:14 PM  PHQ 2/9 Scores  PHQ - 2 Score 0 0  PHQ- 9 Score  0      Data saved with a previous flowsheet row definition      Past Medical History:  Diagnosis Date   Arthritis    Headache(784.0)    IBS (irritable bowel syndrome)    No pertinent past medical history    Rosacea    Face   Past Surgical History:  Procedure Laterality Date   CHOLECYSTECTOMY     COLONOSCOPY WITH PROPOFOL  N/A 01/07/2024   Procedure: COLONOSCOPY WITH PROPOFOL ;  Surgeon: Shaaron Lamar HERO, MD;  Location: [facility];  Service: Endoscopy;  Laterality: N/A;  1:30 PM, ASA 3   KNEE ARTHROSCOPY  12/2010   rt    KNEE ARTHROSCOPY  2007   lt    MELANOMA EXCISION     back   MOHS SURGERY  12/25/2020    Has had several places frozen, 2 areas on back and 1 area on right arm   Social History   Socioeconomic History   Marital status: Widowed    Spouse name: Not on file   Number of children: Not on file   Years of education: Not on file   Highest education level: Bachelor's degree (e.g., BA, AB, BS)  Occupational History   Not on file  Tobacco Use   Smoking status: Never   Smokeless tobacco: Never  Vaping Use   Vaping status: Never Used  Substance and Sexual Activity   Alcohol use: No   Drug use: No   Sexual activity: Not Currently    Birth control/protection: Post-menopausal  Other Topics Concern   Not on file  Social History Narrative   Widow. 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       Are you right handed or left handed? Right Handed   Are you currently employed ? No    What is your current occupation? Retired    Do you live at home alone? Yes   Who lives with you?    What type of home do you live in: 1 story or 2 story? Lives in an apartment  on the second floor.        Social Drivers of Health   Tobacco Use: Low Risk (01/07/2024)   Patient History    Smoking Tobacco Use: Never    Smokeless Tobacco Use: Never    Passive Exposure: Not on file  Financial Resource Strain: Low Risk (11/25/2024)   Overall Financial Resource Strain (CARDIA)    Difficulty of Paying Living Expenses: Not hard at all  Food Insecurity: No Food Insecurity (11/25/2024)   Epic    Worried About Programme Researcher, Broadcasting/film/video in the Last Year: Never true    Ran Out of Food in the Last Year: Never true  Transportation Needs: No Transportation Needs (11/25/2024)   Epic    Lack of Transportation (Medical): No    Lack of Transportation (Non-Medical): No  Physical Activity: Insufficiently Active (11/25/2024)   Exercise Vital Sign    Days of Exercise per Week: 2 days    Minutes of Exercise per Session: 20 min  Stress: No Stress Concern Present (11/25/2024)   [employer] of Occupational Health - Occupational  Stress Questionnaire    Feeling of Stress: Only a little  Social Connections: Moderately Integrated (11/25/2024)   Social Connection and Isolation Panel    Frequency of Communication with Friends and Family: More than three times a week    Frequency of Social Gatherings with Friends and Family: More than three times a week    Attends Religious Services: More than 4 times per year    Active Member of [employer] or Organizations: Yes    Attends Banker Meetings: More than 4 times per year    Marital Status: Widowed  Intimate Partner Violence: Not on file  Depression (PHQ2-9): Low Risk (08/14/2023)   Depression (PHQ2-9)    PHQ-2 Score: 0  Alcohol Screen: Low Risk (03/04/2024)   Alcohol Screen    Last Alcohol Screening Score (AUDIT): 0  Housing: Unknown (11/25/2024)   Epic    Unable to Pay for Housing in the Last Year: No    Number of Times Moved in the Last Year: Not on file    Homeless in the Last Year: No  Utilities: Not on file  Health Literacy: Not on file   Family History  Problem Relation Age of Onset   Hypertension Mother    Cancer Father    Aneurysm Maternal Grandfather    Hypertension Brother    Hypertension Sister    Thyroid  nodules Sister    Aneurysm Maternal Aunt    Hypertension Brother    Cancer Brother    Other Brother        desert storm issues   Hypertension Brother    Other Sister        suicide   Breast cancer Sister    Allergies[1]    Patient Care Team: Elnor Lauraine BRAVO, NP as PCP - General (Nurse Practitioner) Leigh Venetia CROME, MD as Consulting Physician (Neurology)   Show/hide medication list[2]  Review of Systems  Constitutional:  Negative for diaphoresis, fever and malaise/fatigue.  Respiratory:  Negative for cough, shortness of breath and wheezing.   Cardiovascular:  Positive for leg swelling. Negative for chest pain, palpitations, orthopnea and PND.  Gastrointestinal:  Negative for abdominal pain and blood in stool.  Genitourinary:   Negative for dysuria and hematuria.  Psychiatric/Behavioral:  Negative for suicidal ideas. The patient is nervous/anxious.           Objective:     BP 130/82   Pulse 82  Temp 97.9 F (36.6 C) (Temporal)   Ht 5' 2 (1.575 m)   Wt 248 lb 4 oz (112.6 kg)   LMP 01/24/2019 Comment: had bleeding march 2021  SpO2 97%   BMI 45.41 kg/m  BP Readings from Last 3 Encounters:  11/26/24 130/82  01/07/24 104/65  01/02/24 114/75   Wt Readings from Last 3 Encounters:  11/26/24 248 lb 4 oz (112.6 kg)  01/02/24 240 lb 1.3 oz (108.9 kg)  09/04/23 240 lb (108.9 kg)      Physical Exam Vitals reviewed.  Constitutional:      Appearance: Normal appearance.  HENT:     Head: Normocephalic and atraumatic.     Right Ear: Tympanic membrane, ear canal and external ear normal.     Left Ear: Tympanic membrane, ear canal and external ear normal.  Eyes:     General:        Right eye: No discharge.        Left eye: No discharge.     Extraocular Movements: Extraocular movements intact.     Conjunctiva/sclera: Conjunctivae normal.     Pupils: Pupils are equal, round, and reactive to light.  Neck:     Vascular: No carotid bruit.  Cardiovascular:     Rate and Rhythm: Normal rate and regular rhythm.     Pulses: Normal pulses.     Heart sounds: Normal heart sounds. No murmur heard. Pulmonary:     Effort: Pulmonary effort is normal.     Breath sounds: Normal breath sounds.  Chest:     Comments: Chest exam deferred per patient preference Abdominal:     General: Abdomen is flat. Bowel sounds are normal. There is no distension.     Palpations: Abdomen is soft. There is no mass.     Tenderness: There is no abdominal tenderness.  Musculoskeletal:        General: No tenderness.     Cervical back: Neck supple. No muscular tenderness.     Right lower leg: No edema.     Left lower leg: No edema.  Lymphadenopathy:     Cervical: No cervical adenopathy.     Upper Body:     Right upper body: No  supraclavicular adenopathy.     Left upper body: No supraclavicular adenopathy.  Skin:    General: Skin is warm and dry.  Neurological:     General: No focal deficit present.     Mental Status: She is alert and oriented to person, place, and time.     Motor: No weakness.     Gait: Gait normal.  Psychiatric:        Mood and Affect: Mood normal.        Behavior: Behavior normal.        Judgment: Judgment normal.      No results found for any visits on 11/26/24.     Assessment & Plan:    Routine Health Maintenance and Physical Exam  Immunization History  Administered Date(s) Administered   Influenza,inj,Quad PF,6+ Mos 10/24/2022   Influenza-Unspecified 11/02/2020, 10/15/2021   PFIZER(Purple Top)SARS-COV-2 Vaccination 03/09/2020, 04/07/2020   Pfizer Covid-19 Vaccine Bivalent Booster 28yrs & up 02/15/2021, 08/02/2021   Td 06/06/2008   Tdap 07/03/2020    Health Maintenance  Topic Date Due   Pneumococcal Vaccine: 50+ Years (1 of 2 - PCV) Never done   Zoster Vaccines- Shingrix (1 of 2) Never done   Influenza Vaccine  07/16/2024   COVID-19 Vaccine (5 - 2025-26 season) 08/16/2024  Cervical Cancer Screening (HPV/Pap Cotest)  06/15/2025   Mammogram  09/17/2026   Colonoscopy  01/06/2029   DTaP/Tdap/Td (3 - Td or Tdap) 07/03/2030   Hepatitis C Screening  Completed   HIV Screening  Completed   Hepatitis B Vaccines 19-59 Average Risk  Aged Out   HPV VACCINES  Aged Out   Meningococcal B Vaccine  Aged Out    Discussed health benefits of physical activity, and encouraged her to engage in regular exercise appropriate for her age and condition.  Problem List Items Addressed This Visit       Other   Class 3 severe obesity without serious comorbidity with body mass index (BMI) of 40.0 to 44.9 in adult [facility]) - Primary   Relevant Orders   CBC   Comprehensive metabolic panel with GFR   Hemoglobin A1c   Lipid panel   TSH   Brain natriuretic peptide   Hyperlipidemia   Relevant  Orders   CBC   Comprehensive metabolic panel with GFR   Hemoglobin A1c   Lipid panel   TSH   Brain natriuretic peptide   Fatigue   Relevant Orders   CBC   Comprehensive metabolic panel with GFR   Hemoglobin A1c   Lipid panel   TSH   Brain natriuretic peptide   Encounter for general adult medical examination with abnormal findings   Relevant Orders   CBC   Comprehensive metabolic panel with GFR   Hemoglobin A1c   Lipid panel   TSH   Brain natriuretic peptide   Prediabetes   Relevant Orders   CBC   Comprehensive metabolic panel with GFR   Hemoglobin A1c   Lipid panel   TSH   Brain natriuretic peptide   Assessment and Plan Assessment & Plan Class 3 severe obesity BMI 45.41 Labs ordered, further recommendations may be made based upon these results  Prediabetes Previous A1c 5.7%. Concern for increased A1c due to weight gain. - Ordered A1c test for glycemic control assessment.  Bilateral lower extremity swelling Chronic and stable. Likely due to effusions r/t arthritis and peripheral vascular disease. No exertional fatigue or orthopnea. No signs of heart failure. - Ordered BNP test for heart failure evaluation. - Consider echocardiogram if clinical signs of heart failure arise - Continue compression stocking use  Colon cancer screening Colonoscopy in January 2025. Due for repeat in 5 years.   Health Maintenance - Discussed health maintenance, handout provided - Prevnar 20 administered today, VIS provided    Return in about 6 months (around 05/27/2025) for F/U with Nirvi Boehler.     Lauraine FORBES Pereyra, NP      [1] No Known Allergies [2]  Outpatient Medications Prior to Visit  Medication Sig   acetaminophen (TYLENOL) 500 MG tablet Take 500 mg by mouth every 6 (six) hours as needed for mild pain. As needed.   Ascorbic Acid (VITAMIN C) 1000 MG tablet Take 1,000 mg by mouth daily.   Cholecalciferol (VITAMIN D3) 125 MCG (5000 UT) CAPS    diclofenac Sodium (VOLTAREN) 1 %  GEL Apply topically 4 (four) times daily.   diphenhydrAMINE  (BENADRYL ) 25 MG tablet Take 25 mg by mouth every 6 (six) hours as needed for allergies.   doxycycline (ADOXA) 100 MG tablet Take 100 mg by mouth daily. As needed for rosacea   loratadine (CLARITIN) 10 MG tablet Take 10 mg by mouth daily.   MAGNESIUM PO Take by mouth daily.   Multiple Vitamin (MULTIVITAMIN WITH MINERALS) TABS tablet Take 1 tablet by  mouth daily.   naproxen sodium (ALEVE) 220 MG tablet Take 220 mg by mouth.   [DISCONTINUED] Potassium 99 MG TABS Take by mouth daily.   No facility-administered medications prior to visit.

## 2024-11-26 NOTE — Assessment & Plan Note (Signed)
 Colon cancer screening Colonoscopy in January 2025. Due for repeat in 5 years.   Health Maintenance - Discussed health maintenance, handout provided - Prevnar 20 administered today, VIS provided

## 2024-11-26 NOTE — Assessment & Plan Note (Signed)
 Labs ordered, further recommendations may be made based upon these results

## 2024-11-26 NOTE — Addendum Note (Signed)
 Addended by: LEAR, Alora Gorey P on: 11/26/2024 03:02 PM   Modules accepted: Orders

## 2024-11-26 NOTE — Assessment & Plan Note (Signed)
 Prediabetes Previous A1c 5.7%. Concern for increased A1c due to weight gain. - Ordered A1c test for glycemic control assessment.

## 2024-11-26 NOTE — Assessment & Plan Note (Signed)
 Class 3 severe obesity BMI 45.41 Labs ordered, further recommendations may be made based upon these results

## 2024-11-29 ENCOUNTER — Ambulatory Visit: Payer: Self-pay | Admitting: Nurse Practitioner

## 2024-12-02 DIAGNOSIS — D485 Neoplasm of uncertain behavior of skin: Secondary | ICD-10-CM | POA: Diagnosis not present

## 2024-12-02 DIAGNOSIS — L7211 Pilar cyst: Secondary | ICD-10-CM | POA: Diagnosis not present
